# Patient Record
Sex: Female | Born: 1945 | Race: White | Hispanic: No | Marital: Married | Smoking: Never smoker
Health system: Southern US, Community
[De-identification: ages and names within clinical notes are randomized; demographics above are authoritative.]

## PROBLEM LIST (undated history)

## (undated) DIAGNOSIS — F319 Bipolar disorder, unspecified: Secondary | ICD-10-CM

## (undated) HISTORY — PX: CATARACT EXTRACTION: SUR2

## (undated) HISTORY — DX: Bipolar disorder, unspecified: F31.9

---

## 2012-02-08 HISTORY — PX: FACELIFT, LOWER 2/3: SHX1567

## 2015-02-18 DIAGNOSIS — F3181 Bipolar II disorder: Secondary | ICD-10-CM | POA: Diagnosis not present

## 2015-02-20 DIAGNOSIS — H9313 Tinnitus, bilateral: Secondary | ICD-10-CM | POA: Diagnosis not present

## 2015-02-20 DIAGNOSIS — H903 Sensorineural hearing loss, bilateral: Secondary | ICD-10-CM | POA: Diagnosis not present

## 2015-02-27 DIAGNOSIS — F3181 Bipolar II disorder: Secondary | ICD-10-CM | POA: Diagnosis not present

## 2015-03-04 DIAGNOSIS — F3181 Bipolar II disorder: Secondary | ICD-10-CM | POA: Diagnosis not present

## 2015-03-16 DIAGNOSIS — Z1231 Encounter for screening mammogram for malignant neoplasm of breast: Secondary | ICD-10-CM | POA: Diagnosis not present

## 2015-03-18 DIAGNOSIS — F3181 Bipolar II disorder: Secondary | ICD-10-CM | POA: Diagnosis not present

## 2015-03-25 DIAGNOSIS — M25561 Pain in right knee: Secondary | ICD-10-CM | POA: Diagnosis not present

## 2015-03-25 DIAGNOSIS — G8929 Other chronic pain: Secondary | ICD-10-CM | POA: Diagnosis not present

## 2015-04-01 DIAGNOSIS — F3181 Bipolar II disorder: Secondary | ICD-10-CM | POA: Diagnosis not present

## 2015-04-03 DIAGNOSIS — F3181 Bipolar II disorder: Secondary | ICD-10-CM | POA: Diagnosis not present

## 2015-04-09 DIAGNOSIS — L820 Inflamed seborrheic keratosis: Secondary | ICD-10-CM | POA: Diagnosis not present

## 2015-04-09 DIAGNOSIS — Z08 Encounter for follow-up examination after completed treatment for malignant neoplasm: Secondary | ICD-10-CM | POA: Diagnosis not present

## 2015-04-09 DIAGNOSIS — Z85828 Personal history of other malignant neoplasm of skin: Secondary | ICD-10-CM | POA: Diagnosis not present

## 2015-04-09 DIAGNOSIS — D2261 Melanocytic nevi of right upper limb, including shoulder: Secondary | ICD-10-CM | POA: Diagnosis not present

## 2015-04-09 DIAGNOSIS — Z789 Other specified health status: Secondary | ICD-10-CM | POA: Diagnosis not present

## 2015-04-09 DIAGNOSIS — L538 Other specified erythematous conditions: Secondary | ICD-10-CM | POA: Diagnosis not present

## 2015-04-09 DIAGNOSIS — L608 Other nail disorders: Secondary | ICD-10-CM | POA: Diagnosis not present

## 2015-04-09 DIAGNOSIS — D225 Melanocytic nevi of trunk: Secondary | ICD-10-CM | POA: Diagnosis not present

## 2015-04-09 DIAGNOSIS — Z7189 Other specified counseling: Secondary | ICD-10-CM | POA: Diagnosis not present

## 2015-04-09 DIAGNOSIS — D2262 Melanocytic nevi of left upper limb, including shoulder: Secondary | ICD-10-CM | POA: Diagnosis not present

## 2015-04-15 DIAGNOSIS — F3181 Bipolar II disorder: Secondary | ICD-10-CM | POA: Diagnosis not present

## 2015-05-01 DIAGNOSIS — N6489 Other specified disorders of breast: Secondary | ICD-10-CM | POA: Diagnosis not present

## 2015-05-01 DIAGNOSIS — R928 Other abnormal and inconclusive findings on diagnostic imaging of breast: Secondary | ICD-10-CM | POA: Diagnosis not present

## 2015-05-18 DIAGNOSIS — H18413 Arcus senilis, bilateral: Secondary | ICD-10-CM | POA: Diagnosis not present

## 2015-05-18 DIAGNOSIS — M1711 Unilateral primary osteoarthritis, right knee: Secondary | ICD-10-CM | POA: Diagnosis not present

## 2015-05-18 DIAGNOSIS — M25361 Other instability, right knee: Secondary | ICD-10-CM | POA: Diagnosis not present

## 2015-05-18 DIAGNOSIS — M25561 Pain in right knee: Secondary | ICD-10-CM | POA: Diagnosis not present

## 2015-05-18 DIAGNOSIS — S8001XA Contusion of right knee, initial encounter: Secondary | ICD-10-CM | POA: Diagnosis not present

## 2015-05-18 DIAGNOSIS — H25813 Combined forms of age-related cataract, bilateral: Secondary | ICD-10-CM | POA: Diagnosis not present

## 2015-06-09 DIAGNOSIS — E784 Other hyperlipidemia: Secondary | ICD-10-CM | POA: Diagnosis not present

## 2015-06-22 DIAGNOSIS — H6123 Impacted cerumen, bilateral: Secondary | ICD-10-CM | POA: Diagnosis not present

## 2015-07-02 DIAGNOSIS — F3181 Bipolar II disorder: Secondary | ICD-10-CM | POA: Diagnosis not present

## 2015-08-17 DIAGNOSIS — M1711 Unilateral primary osteoarthritis, right knee: Secondary | ICD-10-CM | POA: Diagnosis not present

## 2015-08-17 DIAGNOSIS — M7631 Iliotibial band syndrome, right leg: Secondary | ICD-10-CM | POA: Diagnosis not present

## 2015-08-17 DIAGNOSIS — M25561 Pain in right knee: Secondary | ICD-10-CM | POA: Diagnosis not present

## 2015-08-17 DIAGNOSIS — R262 Difficulty in walking, not elsewhere classified: Secondary | ICD-10-CM | POA: Diagnosis not present

## 2015-08-19 DIAGNOSIS — R262 Difficulty in walking, not elsewhere classified: Secondary | ICD-10-CM | POA: Diagnosis not present

## 2015-08-19 DIAGNOSIS — M7631 Iliotibial band syndrome, right leg: Secondary | ICD-10-CM | POA: Diagnosis not present

## 2015-08-19 DIAGNOSIS — M25561 Pain in right knee: Secondary | ICD-10-CM | POA: Diagnosis not present

## 2015-08-20 DIAGNOSIS — J029 Acute pharyngitis, unspecified: Secondary | ICD-10-CM | POA: Diagnosis not present

## 2015-08-24 DIAGNOSIS — M7631 Iliotibial band syndrome, right leg: Secondary | ICD-10-CM | POA: Diagnosis not present

## 2015-08-24 DIAGNOSIS — M25561 Pain in right knee: Secondary | ICD-10-CM | POA: Diagnosis not present

## 2015-08-24 DIAGNOSIS — R262 Difficulty in walking, not elsewhere classified: Secondary | ICD-10-CM | POA: Diagnosis not present

## 2015-08-28 DIAGNOSIS — M7631 Iliotibial band syndrome, right leg: Secondary | ICD-10-CM | POA: Diagnosis not present

## 2015-08-28 DIAGNOSIS — R262 Difficulty in walking, not elsewhere classified: Secondary | ICD-10-CM | POA: Diagnosis not present

## 2015-08-28 DIAGNOSIS — M25561 Pain in right knee: Secondary | ICD-10-CM | POA: Diagnosis not present

## 2015-08-31 DIAGNOSIS — M25561 Pain in right knee: Secondary | ICD-10-CM | POA: Diagnosis not present

## 2015-08-31 DIAGNOSIS — M7631 Iliotibial band syndrome, right leg: Secondary | ICD-10-CM | POA: Diagnosis not present

## 2015-08-31 DIAGNOSIS — R262 Difficulty in walking, not elsewhere classified: Secondary | ICD-10-CM | POA: Diagnosis not present

## 2015-09-02 DIAGNOSIS — M7631 Iliotibial band syndrome, right leg: Secondary | ICD-10-CM | POA: Diagnosis not present

## 2015-09-02 DIAGNOSIS — R262 Difficulty in walking, not elsewhere classified: Secondary | ICD-10-CM | POA: Diagnosis not present

## 2015-09-02 DIAGNOSIS — M25561 Pain in right knee: Secondary | ICD-10-CM | POA: Diagnosis not present

## 2015-09-10 DIAGNOSIS — M25561 Pain in right knee: Secondary | ICD-10-CM | POA: Diagnosis not present

## 2015-09-10 DIAGNOSIS — M7631 Iliotibial band syndrome, right leg: Secondary | ICD-10-CM | POA: Diagnosis not present

## 2015-09-10 DIAGNOSIS — R262 Difficulty in walking, not elsewhere classified: Secondary | ICD-10-CM | POA: Diagnosis not present

## 2015-09-14 DIAGNOSIS — M7631 Iliotibial band syndrome, right leg: Secondary | ICD-10-CM | POA: Diagnosis not present

## 2015-09-14 DIAGNOSIS — R262 Difficulty in walking, not elsewhere classified: Secondary | ICD-10-CM | POA: Diagnosis not present

## 2015-09-14 DIAGNOSIS — M25561 Pain in right knee: Secondary | ICD-10-CM | POA: Diagnosis not present

## 2015-09-17 DIAGNOSIS — M7631 Iliotibial band syndrome, right leg: Secondary | ICD-10-CM | POA: Diagnosis not present

## 2015-09-17 DIAGNOSIS — R262 Difficulty in walking, not elsewhere classified: Secondary | ICD-10-CM | POA: Diagnosis not present

## 2015-09-17 DIAGNOSIS — M25561 Pain in right knee: Secondary | ICD-10-CM | POA: Diagnosis not present

## 2015-09-23 DIAGNOSIS — D2361 Other benign neoplasm of skin of right upper limb, including shoulder: Secondary | ICD-10-CM | POA: Diagnosis not present

## 2015-10-19 DIAGNOSIS — M2241 Chondromalacia patellae, right knee: Secondary | ICD-10-CM | POA: Diagnosis not present

## 2015-10-19 DIAGNOSIS — M7631 Iliotibial band syndrome, right leg: Secondary | ICD-10-CM | POA: Diagnosis not present

## 2015-10-19 DIAGNOSIS — M1711 Unilateral primary osteoarthritis, right knee: Secondary | ICD-10-CM | POA: Diagnosis not present

## 2015-10-20 DIAGNOSIS — Z23 Encounter for immunization: Secondary | ICD-10-CM | POA: Diagnosis not present

## 2015-11-09 DIAGNOSIS — M1711 Unilateral primary osteoarthritis, right knee: Secondary | ICD-10-CM | POA: Diagnosis not present

## 2015-11-09 DIAGNOSIS — M7631 Iliotibial band syndrome, right leg: Secondary | ICD-10-CM | POA: Diagnosis not present

## 2015-12-01 DIAGNOSIS — M84469A Pathological fracture, unspecified tibia and fibula, initial encounter for fracture: Secondary | ICD-10-CM | POA: Diagnosis not present

## 2015-12-01 DIAGNOSIS — M1711 Unilateral primary osteoarthritis, right knee: Secondary | ICD-10-CM | POA: Diagnosis not present

## 2015-12-29 DIAGNOSIS — M1711 Unilateral primary osteoarthritis, right knee: Secondary | ICD-10-CM | POA: Diagnosis not present

## 2015-12-29 DIAGNOSIS — M84469D Pathological fracture, unspecified tibia and fibula, subsequent encounter for fracture with routine healing: Secondary | ICD-10-CM | POA: Diagnosis not present

## 2016-01-04 DIAGNOSIS — R5383 Other fatigue: Secondary | ICD-10-CM | POA: Diagnosis not present

## 2016-01-04 DIAGNOSIS — F3181 Bipolar II disorder: Secondary | ICD-10-CM | POA: Diagnosis not present

## 2016-01-04 DIAGNOSIS — E7800 Pure hypercholesterolemia, unspecified: Secondary | ICD-10-CM | POA: Diagnosis not present

## 2016-01-04 DIAGNOSIS — G251 Drug-induced tremor: Secondary | ICD-10-CM | POA: Diagnosis not present

## 2016-01-04 DIAGNOSIS — Z79899 Other long term (current) drug therapy: Secondary | ICD-10-CM | POA: Diagnosis not present

## 2016-03-24 DIAGNOSIS — H938X3 Other specified disorders of ear, bilateral: Secondary | ICD-10-CM | POA: Diagnosis not present

## 2016-03-25 DIAGNOSIS — Z111 Encounter for screening for respiratory tuberculosis: Secondary | ICD-10-CM | POA: Diagnosis not present

## 2016-04-08 DIAGNOSIS — Z1231 Encounter for screening mammogram for malignant neoplasm of breast: Secondary | ICD-10-CM | POA: Diagnosis not present

## 2016-05-18 DIAGNOSIS — M25861 Other specified joint disorders, right knee: Secondary | ICD-10-CM | POA: Diagnosis not present

## 2016-05-18 DIAGNOSIS — R2689 Other abnormalities of gait and mobility: Secondary | ICD-10-CM | POA: Diagnosis not present

## 2016-05-18 DIAGNOSIS — R278 Other lack of coordination: Secondary | ICD-10-CM | POA: Diagnosis not present

## 2016-05-24 DIAGNOSIS — L814 Other melanin hyperpigmentation: Secondary | ICD-10-CM | POA: Diagnosis not present

## 2016-05-24 DIAGNOSIS — D1801 Hemangioma of skin and subcutaneous tissue: Secondary | ICD-10-CM | POA: Diagnosis not present

## 2016-05-24 DIAGNOSIS — L821 Other seborrheic keratosis: Secondary | ICD-10-CM | POA: Diagnosis not present

## 2016-05-25 DIAGNOSIS — R278 Other lack of coordination: Secondary | ICD-10-CM | POA: Diagnosis not present

## 2016-05-25 DIAGNOSIS — R2689 Other abnormalities of gait and mobility: Secondary | ICD-10-CM | POA: Diagnosis not present

## 2016-05-25 DIAGNOSIS — M25861 Other specified joint disorders, right knee: Secondary | ICD-10-CM | POA: Diagnosis not present

## 2016-06-02 DIAGNOSIS — M1731 Unilateral post-traumatic osteoarthritis, right knee: Secondary | ICD-10-CM | POA: Diagnosis not present

## 2016-06-02 DIAGNOSIS — F319 Bipolar disorder, unspecified: Secondary | ICD-10-CM | POA: Diagnosis not present

## 2016-06-02 DIAGNOSIS — M25861 Other specified joint disorders, right knee: Secondary | ICD-10-CM | POA: Diagnosis not present

## 2016-06-02 DIAGNOSIS — R278 Other lack of coordination: Secondary | ICD-10-CM | POA: Diagnosis not present

## 2016-06-02 DIAGNOSIS — R2689 Other abnormalities of gait and mobility: Secondary | ICD-10-CM | POA: Diagnosis not present

## 2016-06-02 DIAGNOSIS — G250 Essential tremor: Secondary | ICD-10-CM | POA: Diagnosis not present

## 2016-06-02 DIAGNOSIS — L989 Disorder of the skin and subcutaneous tissue, unspecified: Secondary | ICD-10-CM | POA: Diagnosis not present

## 2016-06-08 DIAGNOSIS — M1711 Unilateral primary osteoarthritis, right knee: Secondary | ICD-10-CM | POA: Diagnosis not present

## 2016-06-13 DIAGNOSIS — R278 Other lack of coordination: Secondary | ICD-10-CM | POA: Diagnosis not present

## 2016-06-13 DIAGNOSIS — M25861 Other specified joint disorders, right knee: Secondary | ICD-10-CM | POA: Diagnosis not present

## 2016-06-13 DIAGNOSIS — R2689 Other abnormalities of gait and mobility: Secondary | ICD-10-CM | POA: Diagnosis not present

## 2016-06-16 DIAGNOSIS — R2689 Other abnormalities of gait and mobility: Secondary | ICD-10-CM | POA: Diagnosis not present

## 2016-06-16 DIAGNOSIS — M25861 Other specified joint disorders, right knee: Secondary | ICD-10-CM | POA: Diagnosis not present

## 2016-06-16 DIAGNOSIS — R278 Other lack of coordination: Secondary | ICD-10-CM | POA: Diagnosis not present

## 2016-06-18 DIAGNOSIS — M1711 Unilateral primary osteoarthritis, right knee: Secondary | ICD-10-CM | POA: Diagnosis not present

## 2016-06-21 DIAGNOSIS — Z79899 Other long term (current) drug therapy: Secondary | ICD-10-CM | POA: Diagnosis not present

## 2016-06-21 DIAGNOSIS — Z0289 Encounter for other administrative examinations: Secondary | ICD-10-CM | POA: Diagnosis not present

## 2016-06-21 DIAGNOSIS — F3131 Bipolar disorder, current episode depressed, mild: Secondary | ICD-10-CM | POA: Diagnosis not present

## 2016-06-24 DIAGNOSIS — M1711 Unilateral primary osteoarthritis, right knee: Secondary | ICD-10-CM | POA: Diagnosis not present

## 2016-06-28 DIAGNOSIS — F3131 Bipolar disorder, current episode depressed, mild: Secondary | ICD-10-CM | POA: Diagnosis not present

## 2016-07-02 ENCOUNTER — Ambulatory Visit (INDEPENDENT_AMBULATORY_CARE_PROVIDER_SITE_OTHER): Payer: Medicare Other | Admitting: Physician Assistant

## 2016-07-02 ENCOUNTER — Encounter: Payer: Self-pay | Admitting: Physician Assistant

## 2016-07-02 VITALS — BP 90/58 | HR 84 | Temp 98.3°F | Resp 16 | Ht 64.0 in | Wt 131.4 lb

## 2016-07-02 DIAGNOSIS — R591 Generalized enlarged lymph nodes: Secondary | ICD-10-CM

## 2016-07-02 DIAGNOSIS — J029 Acute pharyngitis, unspecified: Secondary | ICD-10-CM | POA: Diagnosis not present

## 2016-07-02 LAB — POCT RAPID STREP A (OFFICE): RAPID STREP A SCREEN: NEGATIVE

## 2016-07-02 MED ORDER — AMOXICILLIN 875 MG PO TABS: 875.0000 mg | ORAL_TABLET | Freq: Two times a day (BID) | ORAL | 0 refills | Status: DC

## 2016-07-02 NOTE — Progress Notes (Signed)
07/03/2016 9:30 AM   DOB: 01-08-1946 / MRN: [id number]  SUBJECTIVE:  Sandra Cox is a 71 y.o. female presenting for sore throat, cough, nasal congestion that has persisted for about 5 days now.  She associates laryngitis and tactile fever. Has taken decongestants and ASA and these are helping her somewhat. She is getting worse or better. She has a long history of low blood pressure.    Current Outpatient Prescriptions:  .  diphenoxylate-atropine (LOMOTIL) 2.5-0.025 MG tablet, Take 1 tablet by mouth 4 (four) times daily as needed for diarrhea or loose stools., Disp: , Rfl:  .  lithium carbonate 150 MG capsule, Take 150 mg by mouth 3 (three) times daily with meals., Disp: , Rfl:  .  lurasidone (LATUDA) 20 MG TABS tablet, Take 20 mg by mouth daily., Disp: , Rfl:  .  Pitavastatin Calcium (LIVALO) 2 MG TABS, Take 1 tablet by mouth., Disp: , Rfl:  .  venlafaxine XR (EFFEXOR XR) 37.5 MG 24 hr capsule, Take 37.5 mg by mouth daily with breakfast., Disp: , Rfl:  .  amoxicillin (AMOXIL) 875 MG tablet, Take 1 tablet (875 mg total) by mouth 2 (two) times daily., Disp: 20 tablet, Rfl: 0    There is no immunization history on file for this patient.   She has no allergies on file.   She  has a past medical history of Bipolar disorder ([place]).    She  reports that she has never smoked. She has never used smokeless tobacco. She reports that she does not drink alcohol or use drugs. She  reports that she does not currently engage in sexual activity. The patient  has a past surgical history that includes Facelift, lower 2/3 (2014).  Her Family history is unknown by patient.  Review of Systems  Respiratory: Negative for cough.   Cardiovascular: Negative for chest pain and leg swelling.  Musculoskeletal: Negative for falls.  Neurological: Negative for dizziness and headaches.    The problem list and medications were reviewed and updated by myself where necessary and exist elsewhere in the encounter.    OBJECTIVE:  BP (!) 90/58 (BP Location: Right Arm, Patient Position: Sitting, Cuff Size: Normal)   Pulse 84   Temp 98.3 F (36.8 C) (Oral)   Resp 16   Ht 5\' 4"  (1.626 m)   Wt 131 lb 6 oz (59.6 kg)   SpO2 98%   BMI 22.55 kg/m   BP Readings from Last 3 Encounters:  07/02/16 (!) 90/58     Physical Exam  Constitutional: She is active.  Non-toxic appearance.  HENT:  Right Ear: Hearing, tympanic membrane, external ear and ear canal normal.  Left Ear: Hearing, tympanic membrane, external ear and ear canal normal.  Nose: Nose normal. Right sinus exhibits no maxillary sinus tenderness and no frontal sinus tenderness. Left sinus exhibits no maxillary sinus tenderness and no frontal sinus tenderness.  Mouth/Throat: Uvula is midline and mucous membranes are normal. Mucous membranes are not dry. Posterior oropharyngeal erythema present. No oropharyngeal exudate, posterior oropharyngeal edema or tonsillar abscesses.  Cardiovascular: Normal rate, regular rhythm, S1 normal, S2 normal, normal heart sounds and intact distal pulses.  Exam reveals no gallop, no friction rub and no decreased pulses.   No murmur heard. Pulmonary/Chest: Effort normal. No stridor. No tachypnea. No respiratory distress. She has no wheezes. She has no rales.  Abdominal: She exhibits no distension.  Musculoskeletal: She exhibits no edema.  Lymphadenopathy:       Head (right side): No submandibular and  no tonsillar adenopathy present.       Head (left side): No submandibular and no tonsillar adenopathy present.    She has no cervical adenopathy.  Neurological: She is alert.  Skin: Skin is warm and dry. She is not diaphoretic. No pallor.    Results for orders placed or performed in visit on 07/02/16 (from the past 72 hour(s))  POCT rapid strep A     Status: None   Collection Time: 07/02/16  4:18 PM  Result Value Ref Range   Rapid Strep A Screen Negative Negative    No results found.  ASSESSMENT AND PLAN:  Sandra Cox  was seen today for sore throat and cough.  Diagnoses and all orders for this visit:  Sore throat: She hs cough, however the sore throat is somewhat prolonged for a viral uri and she has left sided tonsillar lymphadenopathy along with subjective fever.  She preferes to treat and await culture results regardless of rapid results.  Will treat and call if culture is negative.  -     amoxicillin (AMOXIL) 875 MG tablet; Take 1 tablet (875 mg total) by mouth 2 (two) times daily. -     POCT rapid strep A -     Culture, Group A Strep  Lymphadenopathy: See problem one.     The patient is advised to call or return to clinic if she does not see an improvement in symptoms, or to seek the care of the closest emergency department if she worsens with the above plan.   Philis Fendt, MHS, PA-C [facility] 07/03/2016 9:30 AM

## 2016-07-02 NOTE — Patient Instructions (Addendum)
Continue home treatments. Start amoxicillin.  I will contact you if the strep culture is negative, otherwise continue the abx.     IF you received an x-ray today, you will receive an invoice from [facility]. Please contact [facility] at [phone] with questions or concerns regarding your invoice.   IF you received labwork today, you will receive an invoice from [place]. Please contact [facility] at [phone] with questions or concerns regarding your invoice.   Our billing staff will not be able to assist you with questions regarding bills from these companies.  You will be contacted with the lab results as soon as they are available. The fastest way to get your results is to activate your My Chart account. Instructions are located on the last page of this paperwork. If you have not heard from [place] regarding the results in 2 weeks, please contact this office.

## 2016-07-05 LAB — CULTURE, GROUP A STREP: STREP A CULTURE: NEGATIVE

## 2016-07-15 DIAGNOSIS — Z79899 Other long term (current) drug therapy: Secondary | ICD-10-CM | POA: Diagnosis not present

## 2016-07-27 DIAGNOSIS — F3131 Bipolar disorder, current episode depressed, mild: Secondary | ICD-10-CM | POA: Diagnosis not present

## 2016-08-19 DIAGNOSIS — M1711 Unilateral primary osteoarthritis, right knee: Secondary | ICD-10-CM | POA: Diagnosis not present

## 2016-08-25 DIAGNOSIS — N183 Chronic kidney disease, stage 3 (moderate): Secondary | ICD-10-CM | POA: Diagnosis not present

## 2016-08-25 DIAGNOSIS — F319 Bipolar disorder, unspecified: Secondary | ICD-10-CM | POA: Diagnosis not present

## 2016-09-12 DIAGNOSIS — F3131 Bipolar disorder, current episode depressed, mild: Secondary | ICD-10-CM | POA: Diagnosis not present

## 2016-09-30 DIAGNOSIS — M1711 Unilateral primary osteoarthritis, right knee: Secondary | ICD-10-CM | POA: Diagnosis not present

## 2016-10-06 DIAGNOSIS — M1711 Unilateral primary osteoarthritis, right knee: Secondary | ICD-10-CM | POA: Diagnosis not present

## 2016-10-11 DIAGNOSIS — R197 Diarrhea, unspecified: Secondary | ICD-10-CM | POA: Diagnosis not present

## 2016-10-13 DIAGNOSIS — M1711 Unilateral primary osteoarthritis, right knee: Secondary | ICD-10-CM | POA: Diagnosis not present

## 2016-10-25 DIAGNOSIS — F3176 Bipolar disorder, in full remission, most recent episode depressed: Secondary | ICD-10-CM | POA: Diagnosis not present

## 2016-11-04 DIAGNOSIS — L400 Psoriasis vulgaris: Secondary | ICD-10-CM | POA: Diagnosis not present

## 2016-11-04 DIAGNOSIS — L570 Actinic keratosis: Secondary | ICD-10-CM | POA: Diagnosis not present

## 2016-11-04 DIAGNOSIS — D1801 Hemangioma of skin and subcutaneous tissue: Secondary | ICD-10-CM | POA: Diagnosis not present

## 2016-11-04 DIAGNOSIS — L814 Other melanin hyperpigmentation: Secondary | ICD-10-CM | POA: Diagnosis not present

## 2016-11-04 DIAGNOSIS — Z85828 Personal history of other malignant neoplasm of skin: Secondary | ICD-10-CM | POA: Diagnosis not present

## 2016-11-04 DIAGNOSIS — L821 Other seborrheic keratosis: Secondary | ICD-10-CM | POA: Diagnosis not present

## 2016-11-04 DIAGNOSIS — D225 Melanocytic nevi of trunk: Secondary | ICD-10-CM | POA: Diagnosis not present

## 2016-11-24 DIAGNOSIS — F314 Bipolar disorder, current episode depressed, severe, without psychotic features: Secondary | ICD-10-CM | POA: Diagnosis not present

## 2016-11-25 DIAGNOSIS — M1711 Unilateral primary osteoarthritis, right knee: Secondary | ICD-10-CM | POA: Diagnosis not present

## 2016-12-07 DIAGNOSIS — F3131 Bipolar disorder, current episode depressed, mild: Secondary | ICD-10-CM | POA: Diagnosis not present

## 2016-12-08 DIAGNOSIS — Z0000 Encounter for general adult medical examination without abnormal findings: Secondary | ICD-10-CM | POA: Diagnosis not present

## 2016-12-08 DIAGNOSIS — Z23 Encounter for immunization: Secondary | ICD-10-CM | POA: Diagnosis not present

## 2016-12-08 DIAGNOSIS — Z1389 Encounter for screening for other disorder: Secondary | ICD-10-CM | POA: Diagnosis not present

## 2016-12-08 DIAGNOSIS — Z79899 Other long term (current) drug therapy: Secondary | ICD-10-CM | POA: Diagnosis not present

## 2016-12-08 DIAGNOSIS — M810 Age-related osteoporosis without current pathological fracture: Secondary | ICD-10-CM | POA: Diagnosis not present

## 2016-12-08 DIAGNOSIS — F319 Bipolar disorder, unspecified: Secondary | ICD-10-CM | POA: Diagnosis not present

## 2016-12-08 DIAGNOSIS — E7800 Pure hypercholesterolemia, unspecified: Secondary | ICD-10-CM | POA: Diagnosis not present

## 2016-12-08 DIAGNOSIS — N183 Chronic kidney disease, stage 3 (moderate): Secondary | ICD-10-CM | POA: Diagnosis not present

## 2016-12-08 DIAGNOSIS — H903 Sensorineural hearing loss, bilateral: Secondary | ICD-10-CM | POA: Diagnosis not present

## 2016-12-13 DIAGNOSIS — F3131 Bipolar disorder, current episode depressed, mild: Secondary | ICD-10-CM | POA: Diagnosis not present

## 2016-12-20 DIAGNOSIS — F3132 Bipolar disorder, current episode depressed, moderate: Secondary | ICD-10-CM | POA: Diagnosis not present

## 2017-01-09 DIAGNOSIS — F3176 Bipolar disorder, in full remission, most recent episode depressed: Secondary | ICD-10-CM | POA: Diagnosis not present

## 2017-03-02 DIAGNOSIS — M25531 Pain in right wrist: Secondary | ICD-10-CM | POA: Diagnosis not present

## 2017-03-21 DIAGNOSIS — F3131 Bipolar disorder, current episode depressed, mild: Secondary | ICD-10-CM | POA: Diagnosis not present

## 2017-03-30 DIAGNOSIS — Z79899 Other long term (current) drug therapy: Secondary | ICD-10-CM | POA: Diagnosis not present

## 2017-05-10 DIAGNOSIS — H2513 Age-related nuclear cataract, bilateral: Secondary | ICD-10-CM | POA: Diagnosis not present

## 2017-06-09 DIAGNOSIS — Z79899 Other long term (current) drug therapy: Secondary | ICD-10-CM | POA: Diagnosis not present

## 2017-06-15 ENCOUNTER — Encounter (HOSPITAL_COMMUNITY): Payer: Self-pay | Admitting: Psychiatry

## 2017-06-15 ENCOUNTER — Ambulatory Visit (INDEPENDENT_AMBULATORY_CARE_PROVIDER_SITE_OTHER): Payer: Medicare Other | Admitting: Psychiatry

## 2017-06-15 VITALS — BP 122/74 | HR 95 | Ht 65.5 in | Wt 128.0 lb

## 2017-06-15 DIAGNOSIS — F4010 Social phobia, unspecified: Secondary | ICD-10-CM | POA: Diagnosis not present

## 2017-06-15 DIAGNOSIS — F319 Bipolar disorder, unspecified: Secondary | ICD-10-CM | POA: Diagnosis not present

## 2017-06-15 DIAGNOSIS — F411 Generalized anxiety disorder: Secondary | ICD-10-CM | POA: Diagnosis not present

## 2017-06-15 DIAGNOSIS — Z634 Disappearance and death of family member: Secondary | ICD-10-CM | POA: Diagnosis not present

## 2017-06-15 DIAGNOSIS — F331 Major depressive disorder, recurrent, moderate: Secondary | ICD-10-CM | POA: Diagnosis not present

## 2017-06-15 DIAGNOSIS — F909 Attention-deficit hyperactivity disorder, unspecified type: Secondary | ICD-10-CM | POA: Diagnosis not present

## 2017-06-15 DIAGNOSIS — R450 Nervousness: Secondary | ICD-10-CM

## 2017-06-15 MED ORDER — VENLAFAXINE HCL ER 150 MG PO CP24: 150.0000 mg | ORAL_CAPSULE | Freq: Every day | ORAL | 0 refills | Status: DC

## 2017-06-15 MED ORDER — LAMOTRIGINE 25 MG PO TABS: ORAL_TABLET | ORAL | 0 refills | Status: DC

## 2017-06-15 MED ORDER — QUETIAPINE FUMARATE 100 MG PO TABS: ORAL_TABLET | ORAL | 0 refills | Status: DC

## 2017-06-15 NOTE — Addendum Note (Signed)
Addended by: Berniece Andreas T on: 06/15/2017 03:03 PM   Modules accepted: Orders

## 2017-06-15 NOTE — Progress Notes (Signed)
Psychiatric Initial Adult Assessment   Patient Identification: Sandra Cox MRN:  [id number] Date of Evaluation:  06/15/2017 Referral Source: Self-referred. Chief Complaint:  I have anxiety and depression. Visit Diagnosis:    ICD-10-CM   1. Bipolar I disorder (HCC) F31.9 QUEtiapine (SEROQUEL) 100 MG tablet  2. GAD (generalized anxiety disorder) F41.1 venlafaxine XR (EFFEXOR-XR) 150 MG 24 hr capsule    lamoTRIgine (LAMICTAL) 25 MG tablet    History of Present Illness:   Patient is 72 year old Caucasian, separated female who is self-referred for the management of her mental illness.  Patient has bipolar disorder since 70s.  She has been seeing psychiatrist since then.  Patient moved from [place] year ago to live in a retirement community.  She saw [name] at [facility] but did not like the care and like to establish the care at our facility.  Patient told she went into severe depression 2-1/2 months ago and her nurse practitioner [name] told to increase Effexor and Latuda but she was not comfortable.  Patient told that she was able to come out of the depression and she is doing better.  However she still feels anxious and nervous.  Patient told 2 weeks ago her dog died and she was very depressed.  Patient has been separated from her husband after 53 years of marriage.  Patient told her husband is very controlling and demanding but since she is separated they are good friends.  Patient told he is still comes to visit her.  Patient did not like [place] where she stayed one year.  She is happy at her current retirement home.  Patient father-in-law who is 70 year old also live in the same retirement home.  Patient is not sure what trigger depression 2-1/2 months ago.  She was feeling depressed, crying spells, isolated, withdrawn and hopeless.  Patient has 2 daughter who lives out of town.  Patient has no other support system.  She is trying to make new friends but most of them are very  old and she has seen a lot of people deceased in the last year.  Patient appears very nervous and anxious.  She also have difficulty remembering things.  She has tremors which she described for past few years and she takes nadolol 20 mg twice a day.  She is taking multiple psychiatric medication.  She is taking lithium 150 mg daily, Latuda 40 mg daily, Effexor 150 mg daily, Celexa 10 mg daily, Seroquel 100 mg at bedtime and Ativan 0.5 mg half to 1 tablet at bedtime.  Patient told she has been taking most of these medication for many years however last year in [place] and Seroquel was added by a psychiatrist.  It is unclear why she is taking 2 antipsychotic, 2 antidepressant, one mood stabilizer and one antianxiety medication.  Patient reported since she came out from the depression she is feeling better.  Her sleep is improved.  She has more energy.  She denies any paranoia, suicidal thoughts or homicidal thought.  Patient has established a new primary care physician.  Recently she had blood work.  Her lithium level is 0.3, her TSH is normal.  Recently she had other labs done but we do not have results available.  Patient told she has borderline kidney function but she do not remember the numbers.  Patient denies any paranoia, hallucination, anger, mania and psychosis.  She is not seeing any therapist.  She denies drinking or using any illegal substances.     Associated Signs/Symptoms:  Depression Symptoms:  depressed mood, difficulty concentrating, anxiety, (Hypo) Manic Symptoms:  Irritable Mood, Anxiety Symptoms:  Excessive Worry, Social Anxiety, Psychotic Symptoms:  no psychotic symtoms PTSD Symptoms: Negative  Past Psychiatric History:  Patient had history of depression since 65s.  She has seen psychiatrist since then.  She believe her mental illness got worse in her marriage.  Her husband is from [place] and she had lived many places.  She saw psychiatrist in [place] for 15 years and  then moved to [place] where she continued the care with a psychiatrist for 1 year and then last year moved to [place] in a retirement home.  She was seeing [facility].  In the past she had tried Zoloft but that did not work.  In [place] and [place] was added.  Patient denies any history of psychiatric inpatient treatment or any suicidal attempt.  She endorsed history of mania and depression.  She had manic cycles 3-4 times a year.  Previous Psychotropic Medications: Yes   Substance Abuse History in the last 12 months:  No.  Consequences of Substance Abuse: Negative  Past Medical History:  Past Medical History:  Diagnosis Date  . Bipolar disorder [facility])     Past Surgical History:  Procedure Laterality Date  . FACELIFT, LOWER 2/3  2014    Family Psychiatric History: Reviewed.  Family History:  Family History  Family history unknown: Yes    Social History:   Social History   Socioeconomic History  . Marital status: Married    Spouse name: Not on file  . Number of children: 2  . Years of education: Not on file  . Highest education level: Bachelor's degree (e.g., BA, AB, BS)  Occupational History  . Not on file  Social Needs  . Financial resource strain: Not hard at all  . Food insecurity:    Worry: Never true    Inability: Never true  . Transportation needs:    Medical: No    Non-medical: No  Tobacco Use  . Smoking status: Never Smoker  . Smokeless tobacco: Never Used  Substance and Sexual Activity  . Alcohol use: No  . Drug use: No  . Sexual activity: Not Currently  Lifestyle  . Physical activity:    Days per week: 0 days    Minutes per session: 0 min  . Stress: To some extent  Relationships  . Social connections:    Talks on phone: Not on file    Gets together: Not on file    Attends religious service: Not on file    Active member of club or organization: Not on file    Attends meetings of clubs or organizations: Not on file     Relationship status: Not on file  Other Topics Concern  . Not on file  Social History Narrative  . Not on file    Additional Social History:  Patient born in [place].  She married to her husband who was in [place] and she had moved to more than 30 places.  She has 2 daughter, one lives in [place] and other lives in [place].  Patient is separated 4 years ago but she is still have a good relationship with her husband.  She told her husband is very controlling and demanding and she could not continue living with him.  Allergies:  No Known Allergies  Metabolic Disorder Labs: No results found for: HGBA1C, MPG No results found for: PROLACTIN No results found for: CHOL, TRIG, HDL, CHOLHDL,  VLDL, LDLCALC   Current Medications: Current Outpatient Medications  Medication Sig Dispense Refill  . lithium carbonate 150 MG capsule Take 150 mg by mouth 3 (three) times daily with meals.    Marland Kitchen LORazepam (ATIVAN) 0.5 MG tablet Take 0.5 mg by mouth daily.    . nadolol (CORGARD) 20 MG tablet Take 20 mg by mouth 2 (two) times daily.    . Pitavastatin Calcium (LIVALO) 2 MG TABS Take 1 tablet by mouth.    . venlafaxine XR (EFFEXOR-XR) 150 MG 24 hr capsule Take 1 capsule (150 mg total) by mouth daily with breakfast. 30 capsule 0  . lamoTRIgine (LAMICTAL) 25 MG tablet Take one tab daily for 1 week and than 2 tab daily 60 tablet 0  . QUEtiapine (SEROQUEL) 100 MG tablet quetiapine 100 mg tablet 30 tablet 0   No current facility-administered medications for this visit.     Neurologic: Headache: No Seizure: No Paresthesias:No  Musculoskeletal: Strength & Muscle Tone: within normal limits Gait & Station: normal Patient leans: N/A  Psychiatric Specialty Exam: Review of Systems  Psychiatric/Behavioral: The patient is nervous/anxious.     Blood pressure 122/74, pulse 95, height 5' 5.5" (1.664 m), weight 128 lb (58.1 kg).Body mass index is 20.98 kg/m.  General Appearance: Well Groomed   Eye Contact:  Fair  Speech:  Slow  Volume:  Decreased  Mood:  Anxious  Affect:  Congruent  Thought Process:  Goal Directed  Orientation:  Full (Time, Place, and Person)  Thought Content:  Rumination  Suicidal Thoughts:  No  Homicidal Thoughts:  No  Memory:  Immediate;   Good Recent;   Good Remote;   Fair  Judgement:  Good  Insight:  Good  Psychomotor Activity:  Decreased  Concentration:  Concentration: Fair and Attention Span: Fair  Recall:  Good  Fund of Knowledge:Good  Language: Good  Akathisia:  No  Handed:  Right  AIMS (if indicated):  0  Assets:  Communication Skills Desire for Improvement Housing  ADL's:  Intact  Cognition: WNL  Sleep: Fair   Assessment: Bipolar disorder, depressed type.  Generalized anxiety disorder.  Plan: I review her symptoms, history, current medication.  Patient is taking multiple psychotropic medication.  It is unclear why she is taking 2 antipsychotic medication and to antidepressant.  She is willing to cut down the medication but also open to have genetic testing to see what medicine she can take.  We discussed potential side effects of 2 antipsychotic medications.  I recommended to discontinue Latuda and continue Seroquel 100 mg at bedtime.  I will also discontinue lithium since patient is taking subtherapeutic dose.  She reported her kidney function tests are borderline.  We will get blood work results from previous physician.  I will try Lamictal 25 mg daily for 1 week and then 50 mg daily.  Continue Effexor XR 150 mg daily.  Discontinue Celexa.  Continue lorazepam 0.5 mg as needed for insomnia.   Kathlee Nations, MD 5/9/201912:29 PM

## 2017-06-26 DIAGNOSIS — M810 Age-related osteoporosis without current pathological fracture: Secondary | ICD-10-CM | POA: Diagnosis not present

## 2017-06-26 DIAGNOSIS — F319 Bipolar disorder, unspecified: Secondary | ICD-10-CM | POA: Diagnosis not present

## 2017-06-26 DIAGNOSIS — G250 Essential tremor: Secondary | ICD-10-CM | POA: Diagnosis not present

## 2017-06-26 DIAGNOSIS — E7800 Pure hypercholesterolemia, unspecified: Secondary | ICD-10-CM | POA: Diagnosis not present

## 2017-06-26 DIAGNOSIS — N183 Chronic kidney disease, stage 3 (moderate): Secondary | ICD-10-CM | POA: Diagnosis not present

## 2017-06-27 ENCOUNTER — Encounter: Payer: Self-pay | Admitting: Neurology

## 2017-06-29 NOTE — Progress Notes (Signed)
Subjective:   Sandra Cox was seen in consultation in the movement disorder clinic at the request of Lavone Orn, MD.  The evaluation is for tremor.  The records that were made available to me were reviewed.  Tremor started approximately 4-5 years ago and started with the R hand with eating/writing.  It started in the L hand about a year ago.  There is no family hx of tremor.  Been on lithium since 1988.  Been on latuda for 2 months.  Been on seroquel for about a year.    Affected by caffeine:  Yes.   (1 cup coffee per day) Affected by alcohol:  Doesn't drink EtOH Affected by stress:  Yes.   Affected by fatigue:  No. Spills soup if on spoon:  No. Spills glass of liquid if full:  No. Affects ADL's (tying shoes, brushing teeth, etc):  No.  Current/Previously tried tremor medications: nadolol but didn't help tremor  Current medications that may exacerbate tremor:  was on lithium for bipolar depression but was d/c few weeks ago; was on latuda until few weeks ago; on seroquel  Reviewed psychiatry records.  The patient presented to Dr. Adele Schilder as a new patient on Jun 15, 2017.  When she came to him, she was taking lithium, 150 mg daily, Latuda, 40 mg daily, Seroquel 100 mg at bedtime, in addition to other antidepressants.  Dr. Adele Schilder noted that she was on 2 different antipsychotics and he recommended discontinuing the Latuda and continuing the Seroquel.  Lithium was also discontinued.  Outside reports reviewed: historical medical records, lab reports and referral letter/letters.  No Known Allergies  Outpatient Encounter Medications as of 07/04/2017  Medication Sig  . lamoTRIgine (LAMICTAL) 25 MG tablet Take one tab daily for 1 week and than 2 tab daily  . LORazepam (ATIVAN) 0.5 MG tablet Take 0.5 mg by mouth daily.  . nadolol (CORGARD) 20 MG tablet Take 20 mg by mouth 2 (two) times daily.  . QUEtiapine (SEROQUEL) 100 MG tablet quetiapine 100 mg tablet  . venlafaxine XR (EFFEXOR-XR) 150 MG  24 hr capsule Take 1 capsule (150 mg total) by mouth daily with breakfast.  . [DISCONTINUED] Pitavastatin Calcium (LIVALO) 2 MG TABS Take 1 tablet by mouth.   No facility-administered encounter medications on file as of 07/04/2017.     Past Medical History:  Diagnosis Date  . Bipolar disorder [facility])     Past Surgical History:  Procedure Laterality Date  . FACELIFT, LOWER 2/3  2014    Social History   Socioeconomic History  . Marital status: Married    Spouse name: Not on file  . Number of children: 2  . Years of education: Not on file  . Highest education level: Bachelor's degree (e.g., BA, AB, BS)  Occupational History  . Not on file  Social Needs  . Financial resource strain: Not hard at all  . Food insecurity:    Worry: Never true    Inability: Never true  . Transportation needs:    Medical: No    Non-medical: No  Tobacco Use  . Smoking status: Never Smoker  . Smokeless tobacco: Never Used  Substance and Sexual Activity  . Alcohol use: No  . Drug use: No  . Sexual activity: Not Currently  Lifestyle  . Physical activity:    Days per week: 0 days    Minutes per session: 0 min  . Stress: To some extent  Relationships  . Social connections:    Talks on  phone: Not on file    Gets together: Not on file    Attends religious service: Not on file    Active member of club or organization: Not on file    Attends meetings of clubs or organizations: Not on file    Relationship status: Not on file  . Intimate partner violence:    Fear of current or ex partner: Not on file    Emotionally abused: Not on file    Physically abused: Not on file    Forced sexual activity: Not on file  Other Topics Concern  . Not on file  Social History Narrative  . Not on file    Family Status  Relation Name Status  . Mother  Deceased  . Father  Deceased  . Daughter x2 Alive    Review of Systems A complete 10 system ROS was obtained and was negative apart from what is  mentioned.   Objective:   VITALS:   Vitals:   07/04/17 0917  BP: 102/60  Pulse: 88  SpO2: 97%  Weight: 128 lb (58.1 kg)  Height: 5' 5.5" (1.664 m)   Gen:  Appears stated age and in NAD. HEENT:  Normocephalic, atraumatic. The mucous membranes are moist. The superficial temporal arteries are without ropiness or tenderness. Cardiovascular: Regular rate and rhythm. Lungs: Clear to auscultation bilaterally. Neck: There are no carotid bruits noted bilaterally.  NEUROLOGICAL:  Orientation:  The patient is alert and oriented x 3.  Recent and remote memory are intact.  Attention span and concentration are normal.  Able to name objects and repeat without trouble.  Fund of knowledge is appropriate Cranial nerves: There is good facial symmetry. There is mild facial hypomimia.   Extraocular muscles are intact and visual fields are full to confrontational testing. Speech is fluent and clear. Soft palate rises symmetrically and there is no tongue deviation. Hearing is intact to conversational tone. Tone: Tone is good throughout. Sensation: Sensation is intact to light touch and pinprick throughout (facial, trunk, extremities). Vibration is intact at the bilateral big toe but it is slightly decreased. There is no extinction with double simultaneous stimulation. There is no sensory dermatomal level identified. Coordination:  The patient has no dysdiadichokinesia or dysmetria. Motor: Strength is 5/5 in the bilateral upper and lower extremities.  Shoulder shrug is equal bilaterally.  There is no pronator drift.  There are no fasciculations noted. DTR's: Deep tendon reflexes are 3-3+/4 at the bilateral biceps, triceps, brachioradialis, patella and achilles.  Plantar responses are downgoing bilaterally. Gait and Station: The patient is able to ambulate without difficulty. The patient is able to heel toe walk without any difficulty. The patient is able to ambulate in a tandem fashion. The patient is able to  stand in the Romberg position.   MOVEMENT EXAM: Tremor:    There is LLE resting tremor and rare resting tremor in the thumb.  There is mouth/chin tremor.   Archimedes spirals with mild tremor on the L only.  She has intention tremor.  When the patient is asked able to pour water from one glass to another, she doesn't spill it but tremor is noted.  No results found for: TSH  I was able to get a copy of her lab work from her primary care physician dated December 08, 2016.  White blood cells are 5.8, hemoglobin 12.4, hematocrit 37.6 and platelets 180.  Sodium was 139, potassium 4.7, chloride 107, CO2 28, BUN 23, creatinine 1.35.  Glucose was 95.  AST  16, ALT 13, alkaline phosphatase 56.   Assessment/Plan:   1.  Tremor  -etiology unknown but suspect that meds are contributing source, if not primary.  Her tremor is mixed rest and intention. When pt first presented to Dr. Adele Schilder a few weeks ago, she was on lithium and 2 different antipsychotics.  Latuda and lithium were d/c (both well known sources of tremor).  She is still on seroquel.  Discussed that if due to antipsychotics it can take up to 6 months after d/c to see if due to medication (and she is still on seroquel).  Wouldn't recommend medication at this time and she agrees.  If still with tremor in 6 months (and I suspect she may), will consider DaT scan.  She and I discussed this today.  She is mildly parkinsonian, but the antipsychotics can cause this as well.  -will do TSH  -will plan on seeing her back in 6 months, sooner should new issues arise.   CC:  Lavone Orn, MD

## 2017-07-04 ENCOUNTER — Ambulatory Visit (INDEPENDENT_AMBULATORY_CARE_PROVIDER_SITE_OTHER): Payer: Medicare Other | Admitting: Neurology

## 2017-07-04 ENCOUNTER — Encounter: Payer: Self-pay | Admitting: Neurology

## 2017-07-04 ENCOUNTER — Other Ambulatory Visit: Payer: Medicare Other

## 2017-07-04 VITALS — BP 102/60 | HR 88 | Ht 65.5 in | Wt 128.0 lb

## 2017-07-04 DIAGNOSIS — G2111 Neuroleptic induced parkinsonism: Secondary | ICD-10-CM | POA: Diagnosis not present

## 2017-07-04 DIAGNOSIS — R251 Tremor, unspecified: Secondary | ICD-10-CM | POA: Diagnosis not present

## 2017-07-04 DIAGNOSIS — F319 Bipolar disorder, unspecified: Secondary | ICD-10-CM

## 2017-07-05 LAB — TSH: TSH: 0.82 mIU/L (ref 0.40–4.50)

## 2017-07-14 ENCOUNTER — Ambulatory Visit (INDEPENDENT_AMBULATORY_CARE_PROVIDER_SITE_OTHER): Payer: Medicare Other | Admitting: Psychiatry

## 2017-07-14 ENCOUNTER — Encounter (HOSPITAL_COMMUNITY): Payer: Self-pay | Admitting: Psychiatry

## 2017-07-14 DIAGNOSIS — M1711 Unilateral primary osteoarthritis, right knee: Secondary | ICD-10-CM | POA: Diagnosis not present

## 2017-07-14 DIAGNOSIS — F411 Generalized anxiety disorder: Secondary | ICD-10-CM

## 2017-07-14 DIAGNOSIS — F319 Bipolar disorder, unspecified: Secondary | ICD-10-CM

## 2017-07-14 MED ORDER — LAMOTRIGINE 25 MG PO TABS: ORAL_TABLET | ORAL | 0 refills | Status: DC

## 2017-07-14 MED ORDER — LORAZEPAM 0.5 MG PO TABS: 0.5000 mg | ORAL_TABLET | Freq: Every day | ORAL | 1 refills | Status: DC

## 2017-07-14 MED ORDER — VENLAFAXINE HCL ER 150 MG PO CP24: 150.0000 mg | ORAL_CAPSULE | Freq: Every day | ORAL | 0 refills | Status: DC

## 2017-07-14 MED ORDER — QUETIAPINE FUMARATE 100 MG PO TABS: ORAL_TABLET | ORAL | 0 refills | Status: DC
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
## 2017-07-17 ENCOUNTER — Telehealth (HOSPITAL_COMMUNITY): Payer: Self-pay

## 2017-07-17 NOTE — Telephone Encounter (Signed)
Received a fax from the pharmacy needing clarification on Quetiapine 100mg  tabs. Directions on the prescription are missing. Please review and advise.

## 2017-07-18 ENCOUNTER — Other Ambulatory Visit (HOSPITAL_COMMUNITY): Payer: Self-pay

## 2017-07-18 DIAGNOSIS — F319 Bipolar disorder, unspecified: Secondary | ICD-10-CM

## 2017-07-18 MED ORDER — QUETIAPINE FUMARATE 100 MG PO TABS: ORAL_TABLET | ORAL | 0 refills | Status: DC

## 2017-07-19 ENCOUNTER — Other Ambulatory Visit (HOSPITAL_COMMUNITY): Payer: Self-pay | Admitting: Psychiatry

## 2017-07-19 ENCOUNTER — Ambulatory Visit (INDEPENDENT_AMBULATORY_CARE_PROVIDER_SITE_OTHER): Payer: Medicare Other | Admitting: Licensed Clinical Social Worker

## 2017-07-19 ENCOUNTER — Encounter (HOSPITAL_COMMUNITY): Payer: Self-pay | Admitting: Licensed Clinical Social Worker

## 2017-07-19 DIAGNOSIS — F319 Bipolar disorder, unspecified: Secondary | ICD-10-CM | POA: Diagnosis not present

## 2017-07-19 DIAGNOSIS — F411 Generalized anxiety disorder: Secondary | ICD-10-CM

## 2017-07-19 MED ORDER — QUETIAPINE FUMARATE 100 MG PO TABS: 100.0000 mg | ORAL_TABLET | Freq: Every day | ORAL | 0 refills | Status: DC

## 2017-07-19 NOTE — Telephone Encounter (Signed)
Information was corrected by Silvio Pate, and sent to pharmacy

## 2017-07-19 NOTE — Progress Notes (Signed)
   THERAPIST PROGRESS NOTE  Session Time: 3:30 p.m. - 4:30 p.m.  Participation Level: Active  Behavioral Response: Well GroomedAlertEuthymic  Type of Therapy: Individual Therapy  Treatment Goals addressed: Diagnosis: Bipolar I Disorder and Generalized Anxiety Disorder  Interventions: CBT and Motivational Interviewing  Summary: Sandra Cox is a 72 y.o. female who presents with Diagnosis: Bipolar I Disorder and Generalized Anxiety Disorder  Suicidal/Homicidal: Nowithout intent/plan  Therapist Response: Sandra Cox participated well in CCA.  She reports that due to medication changes, she's not currently experiencing any negative symptoms of bipolar disorder.  She did identify grief over the loss of her dog, but overall she is functioning well.  Plan: Return again in 4 weeks  Diagnosis: Axis I: Bipolar, Depressed and Generalized Anxiety Disorder      Mindi Curling, LCSW 07/19/2017
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
## 2017-07-28 DIAGNOSIS — M1711 Unilateral primary osteoarthritis, right knee: Secondary | ICD-10-CM | POA: Diagnosis not present

## 2017-08-23 ENCOUNTER — Ambulatory Visit (INDEPENDENT_AMBULATORY_CARE_PROVIDER_SITE_OTHER): Payer: Medicare Other | Admitting: Licensed Clinical Social Worker

## 2017-08-23 ENCOUNTER — Encounter (HOSPITAL_COMMUNITY): Payer: Self-pay | Admitting: Licensed Clinical Social Worker

## 2017-08-23 DIAGNOSIS — F319 Bipolar disorder, unspecified: Secondary | ICD-10-CM | POA: Diagnosis not present

## 2017-08-23 DIAGNOSIS — F411 Generalized anxiety disorder: Secondary | ICD-10-CM

## 2017-08-23 NOTE — Progress Notes (Signed)
   THERAPIST PROGRESS NOTE  Session Time: 1:30pm-2:30pm  Participation Level: Active  Behavioral Response: Well GroomedAlertAnxious and Depressed  Type of Therapy: Individual Therapy  Treatment Goals addressed: Improve Psychiatric Symptoms, elevate mood (increased self-esteem, increased self-compassion, increased interaction), improve unhelpful thought patterns, controlled behavior, moderate mood, deliberate speech and thought process(improved social functioning, healthy adjustment to living situation), Learn about diagnosis, healthy coping skills  Interventions: Motivational Interviewing, CBT, Grounding & Mindfulness Techniques, psychoeducation  Summary: Sandra Cox is a 72 y.o. female who presents with Bipolar I Disorder, depressed and Generalized Anxiety Disorder.   Suicidal/Homicidal: No - without intent/plan  Therapist Response: [name] met with clinician for an individual session.  Sandra Cox discussed her psychiatric symptoms, her current life events and her homework. Sandra Cox shared increased anxiety and depression without a specific cause. Clinician explored using CBT and noted changes since husband and his partner have gone on an [place] cruise. Clinician identified that this may be saddening her due to feeling left out or somewhat jealous about not going on trips like this when they were together. Sandra Cox provided feedback about her comfort level and relationship with husband when they were together and noted that they did not "have fun" together. Sandra Cox processed experiences on vacation with husband and also in her marriage. Sandra Cox identified that she is a better person now without him, even though they are still in daily communication and she emails with his partner.  Clinician explored increasing depression sxs and noted lack of wanting to get out and see people, not wanting to clean up after herself.   Plan: Return again in 2-3 weeks  Diagnosis:     Axis I: Bipolar I Disorder, depressed and  Generalized Anxiety Disorder.    Jobe Marker Paul, LCSW 08/23/2017

## 2017-08-24 ENCOUNTER — Ambulatory Visit (HOSPITAL_COMMUNITY): Payer: Medicare Other | Admitting: Licensed Clinical Social Worker

## 2017-08-30 ENCOUNTER — Ambulatory Visit
Admission: RE | Admit: 2017-08-30 | Discharge: 2017-08-30 | Disposition: A | Discharge status: Home / Self Care | Payer: Medicare Other | Source: Ambulatory Visit | Attending: Internal Medicine | Admitting: Internal Medicine

## 2017-08-30 ENCOUNTER — Other Ambulatory Visit: Payer: Self-pay | Admitting: Internal Medicine

## 2017-08-30 DIAGNOSIS — K5900 Constipation, unspecified: Secondary | ICD-10-CM

## 2017-09-07 DIAGNOSIS — M1711 Unilateral primary osteoarthritis, right knee: Secondary | ICD-10-CM | POA: Diagnosis not present

## 2017-09-18 ENCOUNTER — Telehealth (HOSPITAL_COMMUNITY): Payer: Self-pay | Admitting: Licensed Clinical Social Worker

## 2017-09-19 NOTE — Telephone Encounter (Signed)
Arfeen patient -   Could you please review the previous message and note from last visit and let me know what you recommend? Thank you

## 2017-09-20 NOTE — Telephone Encounter (Signed)
Is cobos available to see her?  She can see me for a visit if I have slots.  Lithium would be pretty harsh for her age and carry many risks.  There is probably safer ways we can help

## 2017-09-20 NOTE — Telephone Encounter (Signed)
Called patient and left voicemail letting her know that we can get her in to see a doctor sooner. I left my number for her to call back

## 2017-09-21 ENCOUNTER — Ambulatory Visit (INDEPENDENT_AMBULATORY_CARE_PROVIDER_SITE_OTHER): Payer: Medicare Other | Admitting: Licensed Clinical Social Worker

## 2017-09-21 ENCOUNTER — Encounter (HOSPITAL_COMMUNITY): Payer: Self-pay | Admitting: Licensed Clinical Social Worker

## 2017-09-21 DIAGNOSIS — F319 Bipolar disorder, unspecified: Secondary | ICD-10-CM

## 2017-09-21 DIAGNOSIS — F411 Generalized anxiety disorder: Secondary | ICD-10-CM

## 2017-09-21 NOTE — Progress Notes (Signed)
   THERAPIST PROGRESS NOTE  Session Time: 1:30pm-2:30pm  Participation Level: Active  Behavioral Response: Well GroomedAlertAnxious, Depressed and Hopeless  Type of Therapy: Individual Therapy  Treatment Goals addressed: Improve Psychiatric Symptoms, elevate mood (increased self-esteem, increased self-compassion, increased interaction), improve unhelpful thought patterns, controlled behavior, moderate mood, deliberate speech and thought process(improved social functioning, healthy adjustment to living situation), Learn about diagnosis, healthy coping skills  Interventions: Motivational Interviewing, CBT, Grounding & Mindfulness Techniques, psychoeducation  Summary: Verdene Creson is a 72 y.o. female who presents with Bipolar I Disorder, depressed and Generalized Anxiety Disorder.   Suicidal/Homicidal: No - without intent/plan  Therapist Response: [name] met with clinician for an individual session.  Laketa discussed her psychiatric symptoms, her current life events and her homework. Jalexus shared that since her last session, she has felt increasingly depressed and anxious, not getting dressed or showered for 2-3 days in a row, not wanting to leave her apartment, reduction in appetite, weight loss, and significant fear and anxiety that resulted in full body tremors. In session, Jennah presented tearfully, sensitive to lights, and extremely shaky. She had difficulty focusing. Clinician discussed status, including any suicidal thoughts. Verl denied active suicidal intent, but reported not wanting to be alive. Clinician discussed relationship with husband and her experiences in this relationship, both in the past and currently. She identified the feeling of "wasting her life" in that marriage because she never had her emotional needs met.   Clinician discussed concerns about medication and asked Dr. Doyne Keel to speak with [name] in session. Dr. Doyne Keel reviewed Dr. Marguerite Olea notes and discussed current  medications. Dr. Doyne Keel identified interest in increasing Lamictal from '50mg'$  to '75mg'$  for 2 weeks and then increasing to '100mg'$ .  Jeda agreed and requested medication to be filled at [facility] on [address].  Following session, Tiearra reported that she felt better, as she felt she would be able to actually "do something", rather than just talk about it.   Plan: Return again in 2-3 weeks  Diagnosis:     Axis I: Bipolar I Disorder, depressed and Generalized Anxiety Disorder.   Jobe Marker Bishop, LCSW 09/21/2017

## 2017-09-26 ENCOUNTER — Telehealth (HOSPITAL_COMMUNITY): Payer: Self-pay

## 2017-09-26 NOTE — Telephone Encounter (Signed)
Patient is to start taking 75mg  of Lamictal for 2 weeks then increase to 100 mgs

## 2017-09-28 ENCOUNTER — Other Ambulatory Visit (HOSPITAL_COMMUNITY): Payer: Self-pay | Admitting: Psychiatry

## 2017-09-28 DIAGNOSIS — F411 Generalized anxiety disorder: Secondary | ICD-10-CM

## 2017-09-28 MED ORDER — LAMOTRIGINE 100 MG PO TABS: ORAL_TABLET | ORAL | 0 refills | Status: DC

## 2017-09-28 NOTE — Telephone Encounter (Signed)
Pt is to start Lamictal 100mg  qD on 10/05/2017- script sent to pharmacy

## 2017-10-03 DIAGNOSIS — R194 Change in bowel habit: Secondary | ICD-10-CM | POA: Diagnosis not present

## 2017-10-03 DIAGNOSIS — Z86010 Personal history of colonic polyps: Secondary | ICD-10-CM | POA: Diagnosis not present

## 2017-10-03 DIAGNOSIS — K5900 Constipation, unspecified: Secondary | ICD-10-CM | POA: Diagnosis not present

## 2017-10-05 ENCOUNTER — Ambulatory Visit (INDEPENDENT_AMBULATORY_CARE_PROVIDER_SITE_OTHER): Payer: Medicare Other | Admitting: Licensed Clinical Social Worker

## 2017-10-05 ENCOUNTER — Encounter (HOSPITAL_COMMUNITY): Payer: Self-pay | Admitting: Licensed Clinical Social Worker

## 2017-10-05 ENCOUNTER — Other Ambulatory Visit (HOSPITAL_COMMUNITY): Payer: Self-pay

## 2017-10-05 DIAGNOSIS — F319 Bipolar disorder, unspecified: Secondary | ICD-10-CM

## 2017-10-05 DIAGNOSIS — F411 Generalized anxiety disorder: Secondary | ICD-10-CM

## 2017-10-05 MED ORDER — LAMOTRIGINE 100 MG PO TABS: ORAL_TABLET | ORAL | 0 refills | Status: DC

## 2017-10-05 NOTE — Progress Notes (Signed)
   THERAPIST PROGRESS NOTE  Session Time: 12:30pm-1:30pm  Participation Level: Active  Behavioral Response: Well GroomedAlertAnxious and Euthymic  Type of Therapy: Individual Therapy  Treatment Goals addressed: Improve Psychiatric Symptoms, elevate mood (increased self-esteem, increased self-compassion, increased interaction), improve unhelpful thought patterns, controlled behavior, moderate mood, deliberate speech and thought process(improved social functioning, healthy adjustment to living situation), Learn about diagnosis, healthy coping skills  Interventions: Motivational Interviewing, CBT, Grounding & Mindfulness Techniques, psychoeducation  Summary: Sandra Cox is a 72 y.o. female who presents with Bipolar I Disorder, depressed and Generalized Anxiety Disorder.   Suicidal/Homicidal: No - without intent/plan  Therapist Response: Sandra Cox met with Sandra Cox for an individual session.  Sandra Cox discussed her psychiatric symptoms, her current life events and her homework. Sandra Cox shared improvement in depression sxs about 4 days ago. She reports increase in medication was helpful and "just like that" she felt better. Sandra Cox identified indicated increase again to 100 mg Lamictal this evening and ensured that Sandra Cox had medication sent in to appropriate pharmacy. Sandra Cox discussed Sandra Cox's life and reviewed choices made. Sandra Cox processed Calaya's feelings about making the mistake of marrying her husband, having children, and staying in the relationship for 50 years. Sandra Cox also identified that she knew she made a mistake as she was saying her vows, and consequently began having Bipolar episodes immediately. Sandra Cox provided psychoeducation about mental illness and noted that often people begin sxs of Bipolar in their late teens-early 20s. [name] reported that she has bee "sick" for so many years and now she is able to see things more clearly that she is not in the relationship anymore.    Plan: Return  again in 2-3 weeks  Diagnosis:     Axis I: Bipolar I Disorder, depressed and Generalized Anxiety Disorder.   Jobe Marker [place], LCSW 10/05/2017

## 2017-10-11 ENCOUNTER — Encounter (HOSPITAL_COMMUNITY): Payer: Self-pay | Admitting: Psychiatry

## 2017-10-11 ENCOUNTER — Ambulatory Visit (INDEPENDENT_AMBULATORY_CARE_PROVIDER_SITE_OTHER): Payer: Medicare Other | Admitting: Psychiatry

## 2017-10-11 VITALS — BP 96/67 | HR 94 | Ht 65.25 in | Wt 123.0 lb

## 2017-10-11 DIAGNOSIS — F319 Bipolar disorder, unspecified: Secondary | ICD-10-CM | POA: Diagnosis not present

## 2017-10-11 DIAGNOSIS — Z79899 Other long term (current) drug therapy: Secondary | ICD-10-CM | POA: Diagnosis not present

## 2017-10-11 DIAGNOSIS — F5104 Psychophysiologic insomnia: Secondary | ICD-10-CM | POA: Diagnosis not present

## 2017-10-11 DIAGNOSIS — F411 Generalized anxiety disorder: Secondary | ICD-10-CM | POA: Diagnosis not present

## 2017-10-11 MED ORDER — LORAZEPAM 0.5 MG PO TABS: 0.5000 mg | ORAL_TABLET | Freq: Every day | ORAL | 1 refills | Status: DC

## 2017-10-11 NOTE — Progress Notes (Signed)
[place] MD/PA/NP OP Progress Note  10/11/2017 10:41 AM Sandra Cox  MRN:  [id number]  Chief Complaint: I called to have my medications changed 2 weeks ago due to worsening depression.  I was changed to Lamictal 75 mg and now I am at 100 mg.  I have been taking 2 of my Effexor pills.   HPI: .Sandra Cox is a 72 y.o. female, Caucasian female with a history of bipolar disorder and had been on Lithium since 1988.  Since establishing care with [facility], 06/2017 she has been tapered off Latuda and lithium and started her on Lamictal.  She is also taking Vit D3 Seroquel and Effexor, but had some confusion, and believes she has been taking 300 mg daily since her last visit.  She uses Lorazepam 0.5 mg 3-4 times a week at night if she can not fall asleep. She reports the current medication is working well.  Overall she describes her mood is good.  She is feeling anxious today about her colonoscopy prep tomorrow and procedure on Friday.  She denies any paranoia, AV hallucination, mania, psychosis.  She denies drinking alcohol or using any illegal substances. Her appetite and energy are "good".  She denies SI or HI.   Visit Diagnosis:    ICD-10-CM   1. Bipolar I disorder ([place]) F31.9   2. GAD (generalized anxiety disorder) F41.1   3. 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 Encounter for medication management [phone]     Past Psychiatric History: Reviewed.  Past Medical History:  Past Medical History:  Diagnosis Date  . Bipolar disorder [facility])      She recently seen Wells Guiles Tat for tremors but there were no new medication.  Her tremors are much better since we discontinue Latuda and lithium.  Past Surgical History:  Procedure Laterality Date  . FACELIFT, LOWER 2/3  2014    Family Psychiatric History: Reviewed.  Family History:  Family History  Problem Relation Age of Onset  . Stroke Mother   . Heart attack Father   . Healthy Daughter     Social History:  She is separated from her husband after 76  years of marriage.  She admitted her husband was very controlling and demanding.   Social History   Socioeconomic History  . Marital status: Married    Spouse name: Not on file  . Number of children: 2  . Years of education: Not on file  . Highest education level: Bachelor's degree (e.g., BA, AB, BS)  Occupational History  . Occupation: retired    Comment: Pharmacist, hospital, 3rd grade x 7 years  Social Needs  . Financial resource strain: Not hard at all  . Food insecurity:    Worry: Never true    Inability: Never true  . Transportation needs:    Medical: No    Non-medical: No  Tobacco Use  . Smoking status: Never Smoker  . Smokeless tobacco: Never Used  Substance and Sexual Activity  . Alcohol use: No  . Drug use: No  . Sexual activity: Not Currently  Lifestyle  . Physical activity:    Days per week: 0 days    Minutes per session: 0 min  . Stress: To some extent  Relationships  . Social connections:    Talks on phone: Not on file    Gets together: Not on file    Attends religious service: Not on file    Active member of club or organization: Not on file    Attends meetings of clubs or  organizations: Not on file    Relationship status: Not on file  Other Topics Concern  . Not on file  Social History Narrative  . Not on file    Allergies: No Known Allergies  Metabolic Disorder Labs: No results found for: HGBA1C, MPG No results found for: PROLACTIN No results found for: CHOL, TRIG, HDL, CHOLHDL, VLDL, LDLCALC Lab Results  Component Value Date   TSH 0.82 07/04/2017    Therapeutic Level Labs: No results found for this or any previous visit (from the past 2160 hour(s)). No results found for: LITHIUM No results found for: VALPROATE No components found for:  CBMZ  Current Medications: Current Outpatient Medications  Medication Sig Dispense Refill  . lamoTRIgine (LAMICTAL) 100 MG tablet Take one tab daily starting on 10/05/2017 90 tablet 0  . LORazepam (ATIVAN) 0.5 MG  tablet Take 1 tablet (0.5 mg total) by mouth daily. 30 tablet 1  . nadolol (CORGARD) 20 MG tablet Take 20 mg by mouth 2 (two) times daily.    . QUEtiapine (SEROQUEL) 100 MG tablet Take 1 tablet (100 mg total) by mouth at bedtime. 90 tablet 0  . venlafaxine XR (EFFEXOR-XR) 150 MG 24 hr capsule Take 1 capsule (150 mg total) by mouth daily with breakfast. 90 capsule 0   No current facility-administered medications for this visit.      Musculoskeletal: Strength & Muscle Tone: within normal limits Gait & Station: normal Patient leans: N/A  Psychiatric Specialty Exam: Review of Systems  Constitutional: Negative.   HENT: Negative.   Respiratory: Negative.   Genitourinary: Negative.   Musculoskeletal: Negative.   Skin: Negative.   Psychiatric/Behavioral: Negative for depression, hallucinations, memory loss, substance abuse and suicidal ideas. The patient is nervous/anxious. The patient does not have insomnia.     Blood pressure 96/67, pulse 94, height 5' 5.25" (1.657 m), weight 123 lb (55.8 kg), SpO2 96 %.Body mass index is 20.31 kg/m.  General Appearance: Well Groomed  Eye Contact:  Good  Speech:  Clear and Coherent and Normal Rate  Volume:  Normal  Mood:  Anxious and Euthymic  Affect:  Congruent  Thought Process:  Coherent, Goal Directed, Linear and Descriptions of Associations: Intact  Orientation:  Full (Time, Place, and Person)  Thought Content: Logical and Hallucinations: None   Suicidal Thoughts:  No  Homicidal Thoughts:  No  Memory:  Immediate;   Good Recent;   Good Remote;   Good  Judgement:  Good  Insight:  Good  Psychomotor Activity:  Normal  Concentration:  Concentration: Fair and Attention Span: Fair  Recall:  [employer] of Knowledge: Good  Language: Good  Akathisia:  No  Handed:  Right  AIMS (if indicated): not done  Assets:  Communication Skills Desire for [place] Talents/Skills  ADL's:  Intact   Cognition: WNL  Sleep:  Good   Screenings: PHQ2-9     Office Visit from 07/02/2016 in Primary Care at [facility] Total Score  0       Assessment and Plan: Bipolar disorder type I.  Generalized anxiety disorder. Psychophysiologic insomnia  Patient is happy with current medications and is agreeable to ongoing treatment: Continue Lamictal 50 mg daily, Effexor XR 150 mg daily, Seroquel 100 mg at bedtime and lorazepam 0.5 mg as needed.  Encouraged to keep appointments with a therapist.   Follow-up in 3 months.Time spent 25 minutes.  More than 50% of the time spent in psychoeducation, counseling and coordination of care.  Discuss safety plan that anytime having active suicidal thoughts or homicidal thoughts then patient need to call 911 or go to the local emergency room.    Lavella Hammock, MD 10/11/2017, 10:41 AM

## 2017-10-12 ENCOUNTER — Other Ambulatory Visit: Payer: Self-pay | Admitting: Internal Medicine

## 2017-10-12 ENCOUNTER — Ambulatory Visit
Admission: RE | Admit: 2017-10-12 | Discharge: 2017-10-12 | Disposition: A | Discharge status: Home / Self Care | Payer: Medicare Other | Source: Ambulatory Visit | Attending: Internal Medicine | Admitting: Internal Medicine

## 2017-10-12 DIAGNOSIS — M25572 Pain in left ankle and joints of left foot: Secondary | ICD-10-CM

## 2017-10-12 DIAGNOSIS — S99912A Unspecified injury of left ankle, initial encounter: Secondary | ICD-10-CM | POA: Diagnosis not present

## 2017-10-12 DIAGNOSIS — M7989 Other specified soft tissue disorders: Secondary | ICD-10-CM | POA: Diagnosis not present

## 2017-10-13 DIAGNOSIS — R194 Change in bowel habit: Secondary | ICD-10-CM | POA: Diagnosis not present

## 2017-10-19 ENCOUNTER — Ambulatory Visit (HOSPITAL_COMMUNITY): Payer: Medicare Other | Admitting: Licensed Clinical Social Worker

## 2017-11-02 ENCOUNTER — Encounter (HOSPITAL_COMMUNITY): Payer: Self-pay | Admitting: Licensed Clinical Social Worker

## 2017-11-02 ENCOUNTER — Ambulatory Visit (INDEPENDENT_AMBULATORY_CARE_PROVIDER_SITE_OTHER): Payer: Medicare Other | Admitting: Licensed Clinical Social Worker

## 2017-11-02 DIAGNOSIS — F411 Generalized anxiety disorder: Secondary | ICD-10-CM | POA: Diagnosis not present

## 2017-11-02 DIAGNOSIS — F319 Bipolar disorder, unspecified: Secondary | ICD-10-CM | POA: Diagnosis not present

## 2017-11-02 NOTE — Progress Notes (Signed)
   THERAPIST PROGRESS NOTE  Session Time: 12:30pm-1:30pm  Participation Level: Active  Behavioral Response: Well GroomedAlertEuthymic  Type of Therapy: Individual Therapy  Treatment Goals addressed: Improve Psychiatric Symptoms, elevate mood (increased self-esteem, increased self-compassion, increased interaction), improve unhelpful thought patterns, controlled behavior, moderate mood, deliberate speech and thought process(improved social functioning, healthy adjustment to living situation), Learn about diagnosis, healthy coping skills  Interventions: Motivational Interviewing, CBT, Grounding & Mindfulness Techniques, psychoeducation  Summary: Sandra Cox is a 72 y.o. female who presents with Bipolar I Disorder, depressed and Generalized Anxiety Disorder.   Suicidal/Homicidal: No - without intent/plan  Therapist Response: Sandra Cox met with clinician for an individual session.  Sandra Cox discussed her psychiatric symptoms, her current life events and her homework. Sandra Cox shared relief that her colonoscopy was completed and that she did not have any polyps. She reports that the prep was so horrible that she will never have another one. Clinician processed mood and identified ongoing improvement. Sandra Cox reported that she has come to realize that she has to take control of her thoughts and be responsible for her life and happiness. Sandra Cox reports she never realized how she could change her thoughts and control her emotions in the past. Clinician discussed interactions at her retirement community and noted increased socialization, meeting more people, and making the effort to make plans with them. She also identified increased hugging of friends. Clinician processed thoughts and feelings about hugging and comfort being physically affectionate with others. Sandra Cox identified lack of hugging in her childhood home and lack of interest in hugging with her husband.    Plan: Return again in 2-3 weeks  Diagnosis:      Axis I: Bipolar I Disorder, depressed and Generalized Anxiety Disorder.     Jobe Marker [place], LCSW 11/02/2017

## 2017-11-10 DIAGNOSIS — L821 Other seborrheic keratosis: Secondary | ICD-10-CM | POA: Diagnosis not present

## 2017-11-10 DIAGNOSIS — L570 Actinic keratosis: Secondary | ICD-10-CM | POA: Diagnosis not present

## 2017-11-10 DIAGNOSIS — Z85828 Personal history of other malignant neoplasm of skin: Secondary | ICD-10-CM | POA: Diagnosis not present

## 2017-11-10 DIAGNOSIS — L400 Psoriasis vulgaris: Secondary | ICD-10-CM | POA: Diagnosis not present

## 2017-11-10 DIAGNOSIS — D2272 Melanocytic nevi of left lower limb, including hip: Secondary | ICD-10-CM | POA: Diagnosis not present

## 2017-11-15 DIAGNOSIS — R208 Other disturbances of skin sensation: Secondary | ICD-10-CM | POA: Diagnosis not present

## 2017-12-04 ENCOUNTER — Ambulatory Visit (INDEPENDENT_AMBULATORY_CARE_PROVIDER_SITE_OTHER): Payer: Medicare Other | Admitting: Licensed Clinical Social Worker

## 2017-12-04 ENCOUNTER — Encounter (HOSPITAL_COMMUNITY): Payer: Self-pay | Admitting: Licensed Clinical Social Worker

## 2017-12-04 DIAGNOSIS — F411 Generalized anxiety disorder: Secondary | ICD-10-CM

## 2017-12-04 DIAGNOSIS — F319 Bipolar disorder, unspecified: Secondary | ICD-10-CM

## 2017-12-04 NOTE — Progress Notes (Signed)
   THERAPIST PROGRESS NOTE  Session Time: 10:00am-11:00am  Participation Level: Active  Behavioral Response: Well GroomedAlertEuthymic  Type of Therapy: Individual Therapy  Treatment Goals addressed: Improve Psychiatric Symptoms, elevate mood (increased self-esteem, increased self-compassion, increased interaction), improve unhelpful thought patterns, controlled behavior, moderate mood, deliberate speech and thought process(improved social functioning, healthy adjustment to living situation), Learn about diagnosis, healthy coping skills  Interventions: Motivational Interviewing, CBT, Grounding & Mindfulness Techniques, psychoeducation  Summary: Sandra Cox is a 72 y.o. female who presents with Bipolar I Disorder, depressed and Generalized Anxiety Disorder.   Suicidal/Homicidal: No - without intent/plan  Therapist Response: Sandra Cox met with clinician for an individual session.  Sandra Cox discussed her psychiatric symptoms, her current life events and her homework. [name] shared that her medication has been doing well and she is overall feeling much more like herself. However, she continues to feel lonely and frustrated due to having to be the one to make the effort for socializing in her community. Clinician explored examples of this and noted that Sandra Cox has been making efforts to engage others. However, it is difficult for her to really make close friends due to the age differences. Clinician discussed Sandra Cox's interests and activities she may enjoy in her community. Clinician also provided information about the [facility]. 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    Plan: Return again in 2-3 weeks  Diagnosis:     Axis I: Bipolar I Disorder, depressed and Generalized Anxiety Disorder.    Jobe Marker [place], LCSW 12/04/2017

## 2017-12-18 ENCOUNTER — Ambulatory Visit (HOSPITAL_COMMUNITY): Payer: Medicare Other | Admitting: Licensed Clinical Social Worker

## 2017-12-28 ENCOUNTER — Other Ambulatory Visit: Payer: Self-pay | Admitting: Internal Medicine

## 2017-12-28 DIAGNOSIS — M810 Age-related osteoporosis without current pathological fracture: Secondary | ICD-10-CM | POA: Diagnosis not present

## 2017-12-28 DIAGNOSIS — E7800 Pure hypercholesterolemia, unspecified: Secondary | ICD-10-CM | POA: Diagnosis not present

## 2017-12-28 DIAGNOSIS — G250 Essential tremor: Secondary | ICD-10-CM | POA: Diagnosis not present

## 2017-12-28 DIAGNOSIS — F319 Bipolar disorder, unspecified: Secondary | ICD-10-CM | POA: Diagnosis not present

## 2017-12-28 DIAGNOSIS — R636 Underweight: Secondary | ICD-10-CM | POA: Diagnosis not present

## 2018-01-01 ENCOUNTER — Ambulatory Visit (HOSPITAL_COMMUNITY): Payer: Medicare Other | Admitting: Licensed Clinical Social Worker

## 2018-01-02 ENCOUNTER — Ambulatory Visit (INDEPENDENT_AMBULATORY_CARE_PROVIDER_SITE_OTHER): Payer: Medicare Other | Admitting: Psychiatry

## 2018-01-02 VITALS — BP 116/68 | Ht 65.5 in | Wt 123.4 lb

## 2018-01-02 DIAGNOSIS — F411 Generalized anxiety disorder: Secondary | ICD-10-CM | POA: Diagnosis not present

## 2018-01-02 DIAGNOSIS — F319 Bipolar disorder, unspecified: Secondary | ICD-10-CM

## 2018-01-02 MED ORDER — QUETIAPINE FUMARATE 100 MG PO TABS: 100.0000 mg | ORAL_TABLET | Freq: Every day | ORAL | 0 refills | Status: DC

## 2018-01-02 MED ORDER — LORAZEPAM 0.5 MG PO TABS: 0.5000 mg | ORAL_TABLET | Freq: Every day | ORAL | 1 refills | Status: DC

## 2018-01-02 MED ORDER — LAMOTRIGINE 100 MG PO TABS: ORAL_TABLET | ORAL | 0 refills | Status: DC

## 2018-01-02 MED ORDER — VENLAFAXINE HCL ER 150 MG PO CP24: 150.0000 mg | ORAL_CAPSULE | Freq: Every day | ORAL | 0 refills | Status: DC

## 2018-01-02 NOTE — Progress Notes (Signed)
[place] MD/PA/NP OP Progress Note  01/02/2018 3:31 PM Sandra Cox  MRN:  [id number]  Chief Complaint: I am feeling better since Lamictal dose increased.  HPI: Patient came for her follow-up appointment.  She was seen by covering physician in my absence.  Her Lamictal was increased and she is not taking 100 mg.  She has no rash, itching, tremors or shakes.  She used to take Latuda and lithium which was discontinued when patient complained about tremors shakes.  She saw a neurologist for the tremors but did not find any etiology at that time.  Even though she is feeling better but she started to have tremors again.  She is not sure what causes the tremors but she is taking nadolol which helps her tremors.  Recently her primary care physician Dr. Kelton Pillar started her on pravastatin for high cholesterol.  She feel less depressed and less irritable.  She diagnosed with bipolar and recently she has no mania or any irritability.  She is separated from her husband for past 3 years.  However they have a very good friendship and her husband is coming to visit her on Thanksgiving.  Patient is sleeping better.  She takes half Ativan as needed.  She denies any crying spells, irritability, feeling of hopelessness or worthlessness.  She denies any suicidal thoughts.  She has no rash, itching, tremors or shakes.  We are still awaiting records from [facility] and her primary care physician who recently did blood work on her.  Seeing Janett Billow for therapy.  Visit Diagnosis:    ICD-10-CM   1. Bipolar I disorder (HCC) F31.9 QUEtiapine (SEROQUEL) 100 MG tablet  2. GAD (generalized anxiety disorder) F41.1 venlafaxine XR (EFFEXOR-XR) 150 MG 24 hr capsule    lamoTRIgine (LAMICTAL) 100 MG tablet    LORazepam (ATIVAN) 0.5 MG tablet    DISCONTINUED: LORazepam (ATIVAN) 0.5 MG tablet    Past Psychiatric History: Reviewed. History of depression since 10s.  Saw psychiatrist in [place] and then moved to [place] and then  moved to [place] in a retirement home.  Saw Lattie Haw Pullis had tried psychiatry and prescribed Seroquel and Latuda together.  Also took Zoloft that did not work and lithium that cause tremors.  No history of psychiatric admission treatment or any suicidal attempt.  History of mania and rapid cycling 3-4 times a year. Past Medical History:  Past Medical History:  Diagnosis Date  . Bipolar disorder [facility])     Past Surgical History:  Procedure Laterality Date  . FACELIFT, LOWER 2/3  2014    Family Psychiatric History: Reviewed.  Family History:  Family History  Problem Relation Age of Onset  . Stroke Mother   . Heart attack Father   . Healthy Daughter     Social History:  Social History   Socioeconomic History  . Marital status: Married    Spouse name: Not on file  . Number of children: 2  . Years of education: Not on file  . Highest education level: Bachelor's degree (e.g., BA, AB, BS)  Occupational History  . Occupation: retired    Comment: Pharmacist, hospital, 3rd grade x 7 years  Social Needs  . Financial resource strain: Not hard at all  . Food insecurity:    Worry: Never true    Inability: Never true  . Transportation needs:    Medical: No    Non-medical: No  Tobacco Use  . Smoking status: Never Smoker  . Smokeless tobacco: Never Used  Substance and Sexual Activity  .  Alcohol use: No  . Drug use: No  . Sexual activity: Not Currently  Lifestyle  . Physical activity:    Days per week: 0 days    Minutes per session: 0 min  . Stress: To some extent  Relationships  . Social connections:    Talks on phone: Not on file    Gets together: Not on file    Attends religious service: Not on file    Active member of club or organization: Not on file    Attends meetings of clubs or organizations: Not on file    Relationship status: Not on file  Other Topics Concern  . Not on file  Social History Narrative  . Not on file    Allergies: No Known Allergies  Metabolic Disorder  Labs: No results found for: HGBA1C, MPG No results found for: PROLACTIN No results found for: CHOL, TRIG, HDL, CHOLHDL, VLDL, LDLCALC Lab Results  Component Value Date   TSH 0.82 07/04/2017    Therapeutic Level Labs: No results found for: LITHIUM No results found for: VALPROATE No components found for:  CBMZ  Current Medications: Current Outpatient Medications  Medication Sig Dispense Refill  . lamoTRIgine (LAMICTAL) 100 MG tablet Take one tab daily starting on 10/05/2017 90 tablet 0  . LORazepam (ATIVAN) 0.5 MG tablet Take 1 tablet (0.5 mg total) by mouth daily. 30 tablet 1  . nadolol (CORGARD) 20 MG tablet Take 20 mg by mouth 2 (two) times daily.    . QUEtiapine (SEROQUEL) 100 MG tablet Take 1 tablet (100 mg total) by mouth at bedtime. 90 tablet 0  . venlafaxine XR (EFFEXOR-XR) 150 MG 24 hr capsule Take 1 capsule (150 mg total) by mouth daily with breakfast. 90 capsule 0   No current facility-administered medications for this visit.      Musculoskeletal: Strength & Muscle Tone: within normal limits Gait & Station: normal Patient leans: N/A  Psychiatric Specialty Exam: Review of Systems  Skin: Negative.  Negative for itching and rash.  Neurological: Positive for tremors.    Blood pressure 116/68, height 5' 5.5" (1.664 m), weight 123 lb 6.4 oz (56 kg).There is no height or weight on file to calculate BMI.  General Appearance: Neat and Well Groomed  Eye Contact:  Good  Speech:  Fast but clear and coherent  Volume:  Normal  Mood:  Anxious  Affect:  Congruent  Thought Process:  Descriptions of Associations: Intact  Orientation:  Full (Time, Place, and Person)  Thought Content: Rumination   Suicidal Thoughts:  No  Homicidal Thoughts:  No  Memory:  Immediate;   Good Recent;   Good Remote;   Good  Judgement:  Good  Insight:  Good  Psychomotor Activity:  Tremor  Concentration:  Concentration: Fair and Attention Span: Fair  Recall:  Good  Fund of Knowledge: Good   Language: Good  Akathisia:  No  Handed:  Right  AIMS (if indicated): not done  Assets:  Communication Skills Desire for Improvement Housing Resilience  ADL's:  Intact  Cognition: WNL  Sleep:  Good   Screenings: PHQ2-9     Office Visit from 07/02/2016 in Primary Care at [facility] Total Score  0       Assessment and Plan: Bipolar disorder type I.  Generalized anxiety disorder.  Patient doing better on her current medication.  Continue Effexor XR 150 mg daily, Seroquel 100 mg at bedtime, Lamictal 100 mg daily and lorazepam 0.5 mg as needed for anxiety and insomnia.  Encouraged to continue therapy with Janett Billow.  Patient has no rash, itching, tremors or shakes.  I recommended to see her primary care physician for refills on nadolol.  Patient reported that her tremors are much better with nadolol.  Recommended to call us back if she is any question or any concern.  Follow-up in 3 months.     Kathlee Nations, MD 01/02/2018, 3:31 PM

## 2018-01-02 NOTE — Progress Notes (Deleted)
Subjective:   Sandra Cox was seen in consultation in the movement disorder clinic at the request of Lavone Orn, MD.  The evaluation is for tremor.  The records that were made available to me were reviewed.  Tremor started approximately 4-5 years ago and started with the R hand with eating/writing.  It started in the L hand about a year ago.  There is no family hx of tremor.  Been on lithium since 1988.  Been on latuda for 2 months.  Been on seroquel for about a year.    Affected by caffeine:  Yes.   (1 cup coffee per day) Affected by alcohol:  Doesn't drink EtOH Affected by stress:  Yes.   Affected by fatigue:  No. Spills soup if on spoon:  No. Spills glass of liquid if full:  No. Affects ADL's (tying shoes, brushing teeth, etc):  No.  Current/Previously tried tremor medications: nadolol but didn't help tremor  Current medications that may exacerbate tremor:  was on lithium for bipolar depression but was d/c few weeks ago; was on latuda until few weeks ago; on seroquel  Reviewed psychiatry records.  The patient presented to Dr. Adele Schilder as a new patient on Jun 15, 2017.  When she came to him, she was taking lithium, 150 mg daily, Latuda, 40 mg daily, Seroquel 100 mg at bedtime, in addition to other antidepressants.  Dr. Adele Schilder noted that she was on 2 different antipsychotics and he recommended discontinuing the Latuda and continuing the Seroquel.  Lithium was also discontinued.  01/09/18 update: Patient is seen today in follow-up for tremor.  When I saw her last visit, she had just stopped lithium and Latuda, but remained on Seroquel.  We decided to take a wait and see approach.  Records have been reviewed.  She last saw psychiatry on October 11, 2017.  She remains off of lithium and Latuda.  She is on Lamictal.  She is still on bedtime Seroquel, 100 mg.  Outside reports reviewed: historical medical records, lab reports and referral letter/letters.  No Known Allergies  Outpatient  Encounter Medications as of 01/08/2018  Medication Sig  . lamoTRIgine (LAMICTAL) 100 MG tablet Take one tab daily starting on 10/05/2017  . LORazepam (ATIVAN) 0.5 MG tablet Take 1 tablet (0.5 mg total) by mouth daily.  . nadolol (CORGARD) 20 MG tablet Take 20 mg by mouth 2 (two) times daily.  . QUEtiapine (SEROQUEL) 100 MG tablet Take 1 tablet (100 mg total) by mouth at bedtime.  Marland Kitchen venlafaxine XR (EFFEXOR-XR) 150 MG 24 hr capsule Take 1 capsule (150 mg total) by mouth daily with breakfast.   No facility-administered encounter medications on file as of 01/08/2018.     Past Medical History:  Diagnosis Date  . Bipolar disorder [facility])     Past Surgical History:  Procedure Laterality Date  . FACELIFT, LOWER 2/3  2014    Social History   Socioeconomic History  . Marital status: Married    Spouse name: Not on file  . Number of children: 2  . Years of education: Not on file  . Highest education level: Bachelor's degree (e.g., BA, AB, BS)  Occupational History  . Occupation: retired    Comment: Pharmacist, hospital, 3rd grade x 7 years  Social Needs  . Financial resource strain: Not hard at all  . Food insecurity:    Worry: Never true    Inability: Never true  . Transportation needs:    Medical: No    Non-medical: No  Tobacco Use  . Smoking status: Never Smoker  . Smokeless tobacco: Never Used  Substance and Sexual Activity  . Alcohol use: No  . Drug use: No  . Sexual activity: Not Currently  Lifestyle  . Physical activity:    Days per week: 0 days    Minutes per session: 0 min  . Stress: To some extent  Relationships  . Social connections:    Talks on phone: Not on file    Gets together: Not on file    Attends religious service: Not on file    Active member of club or organization: Not on file    Attends meetings of clubs or organizations: Not on file    Relationship status: Not on file  . Intimate partner violence:    Fear of current or ex partner: Not on file    Emotionally  abused: Not on file    Physically abused: Not on file    Forced sexual activity: Not on file  Other Topics Concern  . Not on file  Social History Narrative  . Not on file    Family Status  Relation Name Status  . Mother  Deceased  . Father  Deceased  . Daughter x2 Alive    Review of Systems A complete 10 system ROS was obtained and was negative apart from what is mentioned.   Objective:   VITALS:   There were no vitals filed for this visit. Gen:  Appears stated age and in NAD. HEENT:  Normocephalic, atraumatic. The mucous membranes are moist. The superficial temporal arteries are without ropiness or tenderness. Cardiovascular: Regular rate and rhythm. Lungs: Clear to auscultation bilaterally. Neck: There are no carotid bruits noted bilaterally.  NEUROLOGICAL:  Orientation:  The patient is alert and oriented x 3.  Recent and remote memory are intact.  Attention span and concentration are normal.  Able to name objects and repeat without trouble.  Fund of knowledge is appropriate Cranial nerves: There is good facial symmetry. There is mild facial hypomimia.   Extraocular muscles are intact and visual fields are full to confrontational testing. Speech is fluent and clear. Soft palate rises symmetrically and there is no tongue deviation. Hearing is intact to conversational tone. Tone: Tone is good throughout. Sensation: Sensation is intact to light touch and pinprick throughout (facial, trunk, extremities). Vibration is intact at the bilateral big toe but it is slightly decreased. There is no extinction with double simultaneous stimulation. There is no sensory dermatomal level identified. Coordination:  The patient has no dysdiadichokinesia or dysmetria. Motor: Strength is 5/5 in the bilateral upper and lower extremities.  Shoulder shrug is equal bilaterally.  There is no pronator drift.  There are no fasciculations noted. DTR's: Deep tendon reflexes are 3-3+/4 at the bilateral  biceps, triceps, brachioradialis, patella and achilles.  Plantar responses are downgoing bilaterally. Gait and Station: The patient is able to ambulate without difficulty. The patient is able to heel toe walk without any difficulty. The patient is able to ambulate in a tandem fashion. The patient is able to stand in the Romberg position.   MOVEMENT EXAM: Tremor:    There is LLE resting tremor and rare resting tremor in the thumb.  There is mouth/chin tremor.   Archimedes spirals with mild tremor on the L only.  She has intention tremor.  When the patient is asked able to pour water from one glass to another, she doesn't spill it but tremor is noted.  Lab Results  Component Value  Date   TSH 0.82 07/04/2017    I was able to get a copy of her lab work from her primary care physician dated December 08, 2016.  White blood cells are 5.8, hemoglobin 12.4, hematocrit 37.6 and platelets 180.  Sodium was 139, potassium 4.7, chloride 107, CO2 28, BUN 23, creatinine 1.35.  Glucose was 95.  AST 16, ALT 13, alkaline phosphatase 56.   Assessment/Plan:   1.  Tremor  -etiology unknown but suspect that meds are contributing source, if not primary.  Her tremor is mixed rest and intention. When pt first presented to Dr. Adele Schilder a few weeks ago, she was on lithium and 2 different antipsychotics.  Latuda and lithium were d/c (both well known sources of tremor).  She is still on seroquel.  Discussed that if due to antipsychotics it can take up to 6 months after d/c to see if due to medication (and she is still on seroquel).  Wouldn't recommend medication at this time and she agrees.  If still with tremor in 6 months (and I suspect she may), will consider DaT scan.  She and I discussed this today.  She is mildly parkinsonian, but the antipsychotics can cause this as well.   CC:  Lavone Orn, MD

## 2018-01-03 ENCOUNTER — Other Ambulatory Visit: Payer: Self-pay | Admitting: Internal Medicine

## 2018-01-03 DIAGNOSIS — M810 Age-related osteoporosis without current pathological fracture: Secondary | ICD-10-CM

## 2018-01-08 ENCOUNTER — Ambulatory Visit: Payer: Medicare Other | Admitting: Neurology

## 2018-01-08 DIAGNOSIS — F319 Bipolar disorder, unspecified: Secondary | ICD-10-CM | POA: Diagnosis not present

## 2018-01-08 DIAGNOSIS — H2513 Age-related nuclear cataract, bilateral: Secondary | ICD-10-CM | POA: Diagnosis not present

## 2018-01-08 DIAGNOSIS — H903 Sensorineural hearing loss, bilateral: Secondary | ICD-10-CM | POA: Diagnosis not present

## 2018-01-08 DIAGNOSIS — Z23 Encounter for immunization: Secondary | ICD-10-CM | POA: Diagnosis not present

## 2018-01-30 ENCOUNTER — Ambulatory Visit
Admission: RE | Admit: 2018-01-30 | Discharge: 2018-01-30 | Disposition: A | Discharge status: Home / Self Care | Payer: Medicare Other | Source: Ambulatory Visit | Attending: Internal Medicine | Admitting: Internal Medicine

## 2018-01-30 DIAGNOSIS — Z780 Asymptomatic menopausal state: Secondary | ICD-10-CM | POA: Diagnosis not present

## 2018-01-30 DIAGNOSIS — M810 Age-related osteoporosis without current pathological fracture: Secondary | ICD-10-CM

## 2018-01-30 DIAGNOSIS — M85851 Other specified disorders of bone density and structure, right thigh: Secondary | ICD-10-CM | POA: Diagnosis not present

## 2018-02-06 ENCOUNTER — Encounter (HOSPITAL_COMMUNITY): Payer: Self-pay | Admitting: Licensed Clinical Social Worker

## 2018-02-06 ENCOUNTER — Ambulatory Visit (INDEPENDENT_AMBULATORY_CARE_PROVIDER_SITE_OTHER): Payer: Medicare Other | Admitting: Licensed Clinical Social Worker

## 2018-02-06 DIAGNOSIS — F319 Bipolar disorder, unspecified: Secondary | ICD-10-CM

## 2018-02-06 NOTE — Progress Notes (Signed)
   THERAPIST PROGRESS NOTE  Session Time: 4:30pm-5:15pm  Participation Level: Active  Behavioral Response: Well GroomedAlertEuthymic  Type of Therapy: Individual Therapy  Treatment Goals addressed: "I don't want to feel as nervous and to decrease my depression".   Interventions: Motivational Interviewing, CBT, Grounding & Mindfulness Techniques, psychoeducation  Summary: Cyara Devoto is a 72 y.o. female who presents with Bipolar I Disorder, depressed and Generalized Anxiety Disorder.   Suicidal/Homicidal: No - without intent/plan  Therapist Response: [name] met with clinician for an individual session.  Maryah discussed her psychiatric symptoms, her current life events and her homework. Kamiryn questioned when her last severe depressive episode occurred and noted it had been the middle of July-middle of August. Clinician explored current status and identified that Keandria is feeling wonderful. She was smiling and laughing, more engaged socially with people at her community, and feeling more confident. Clinician explored the changes and noted that Kahlia gets to be exactly who she is and who she wants to be now that she and her husband are separated. She reports their relationship is wonderful and they are excellent friends. Clinician explored the details of the relationship with husband's girlfriend and noted that they too are great friends and get along well. She reports she is having cataract surgery in 10 days and both husband and his girlfriend will come take care of her. Clinician identified the rarity of this relationship and encouraged Zahria to keep doing what she is doing in order to maintain the positive mood and energy.   Plan: Return again in 6 weeks  Diagnosis:     Axis I: Bipolar I Disorder, depressed and Generalized Anxiety Disorder.   Jobe Marker [place], LCSW 02/06/2018

## 2018-02-15 DIAGNOSIS — H2511 Age-related nuclear cataract, right eye: Secondary | ICD-10-CM | POA: Diagnosis not present

## 2018-02-15 DIAGNOSIS — H25811 Combined forms of age-related cataract, right eye: Secondary | ICD-10-CM | POA: Diagnosis not present

## 2018-02-19 NOTE — Progress Notes (Signed)
Subjective:   Sandra Cox was seen in consultation in the movement disorder clinic at the request of Lavone Orn, MD.  The evaluation is for tremor.  The records that were made available to me were reviewed.  Tremor started approximately 4-5 years ago and st arted with the R hand with eating/writing.  It started in the L hand about a year ago.  There is no family hx of tremor.  Been on lithium since 1988.  Been on latuda for 2 months.  Been on seroquel for about a year.    Affected by caffeine:  Yes.   (1 cup coffee per day) Affected by alcohol:  Doesn't drink EtOH Affected by stress:  Yes.   Affected by fatigue:  No. Spills soup if on spoon:  No. Spills glass of liquid if full:  No. Affects ADL's (tying shoes, brushing teeth, etc):  No.  Current/Previously tried tremor medications: nadolol but didn't help tremor  Current medications that may exacerbate tremor:  was on lithium for bipolar depression but was d/c few weeks ago; was on latuda until few weeks ago; on seroquel  Reviewed psychiatry records.  The patient presented to Dr. Adele Schilder as a new patient on Jun 15, 2017.  When she came to him, she was taking lithium, 150 mg daily, Latuda, 40 mg daily, Seroquel 100 mg at bedtime, in addition to other antidepressants.  Dr. Adele Schilder noted that she was on 2 different antipsychotics and he recommended discontinuing the Latuda and continuing the Seroquel.  Lithium was also discontinued.  Outside reports reviewed: historical medical records, lab reports and referral letter/letters.  02/20/18 update:  Pt seen in f/u for tremor.  Last visit, when I saw the patient, her psychiatrist had just d/c the latuda and lithium and left her on the seroquel, which she is still on.  The records that were made available to me were reviewed.  Last saw psychiatry on 01/02/18.  Stated that she was on nadolol for tremor but only takes that prn and hasn't need that for tremor in a while.   She states today that she has  been doing great.  Depression is under great control.  No tremor.  She is pleased.  No Known Allergies  Outpatient Encounter Medications as of 02/20/2018  Medication Sig  . clobetasol (OLUX) 0.05 % topical foam   . lamoTRIgine (LAMICTAL) 25 MG tablet Take 50 mg by mouth daily.  Marland Kitchen LORazepam (ATIVAN) 0.5 MG tablet Take 1 tablet (0.5 mg total) by mouth daily.  . nadolol (CORGARD) 20 MG tablet Take 20 mg by mouth 2 (two) times daily.  . pravastatin (PRAVACHOL) 40 MG tablet TK 1 T PO QD  . QUEtiapine (SEROQUEL) 100 MG tablet Take 1 tablet (100 mg total) by mouth at bedtime. (Patient taking differently: Take 50 mg by mouth at bedtime. )  . venlafaxine XR (EFFEXOR-XR) 150 MG 24 hr capsule Take 1 capsule (150 mg total) by mouth daily with breakfast.  . [DISCONTINUED] lamoTRIgine (LAMICTAL) 100 MG tablet Take one tab daily starting on 10/05/2017   No facility-administered encounter medications on file as of 02/20/2018.     Past Medical History:  Diagnosis Date  . Bipolar disorder [facility])     Past Surgical History:  Procedure Laterality Date  . FACELIFT, LOWER 2/3  2014    Social History   Socioeconomic History  . Marital status: Married    Spouse name: Not on file  . Number of children: 2  . Years of education:  Not on file  . Highest education level: Bachelor's degree (e.g., BA, AB, BS)  Occupational History  . Occupation: retired    Comment: Pharmacist, hospital, 3rd grade x 7 years  Social Needs  . Financial resource strain: Not hard at all  . Food insecurity:    Worry: Never true    Inability: Never true  . Transportation needs:    Medical: No    Non-medical: No  Tobacco Use  . Smoking status: Never Smoker  . Smokeless tobacco: Never Used  Substance and Sexual Activity  . Alcohol use: No  . Drug use: No  . Sexual activity: Not Currently  Lifestyle  . Physical activity:    Days per week: 0 days    Minutes per session: 0 min  . Stress: To some extent  Relationships  . Social  connections:    Talks on phone: Not on file    Gets together: Not on file    Attends religious service: Not on file    Active member of club or organization: Not on file    Attends meetings of clubs or organizations: Not on file    Relationship status: Not on file  . Intimate partner violence:    Fear of current or ex partner: Not on file    Emotionally abused: Not on file    Physically abused: Not on file    Forced sexual activity: Not on file  Other Topics Concern  . Not on file  Social History Narrative  . Not on file    Family Status  Relation Name Status  . Mother  Deceased  . Father  Deceased  . Daughter x2 Alive    Review of Systems A complete 10 system ROS was obtained and was negative apart from what is mentioned.   Objective:   VITALS:   Vitals:   02/20/18 0817  BP: 90/60  Pulse: 94  SpO2: 97%  Weight: 124 lb (56.2 kg)  Height: 5' 5.5" (1.664 m)   GEN:  The patient appears stated age and is in NAD. HEENT:  Normocephalic, atraumatic.  The mucous membranes are moist. The superficial temporal arteries are without ropiness or tenderness. CV:  RRR Lungs:  CTAB Neck/HEME:  There are no carotid bruits bilaterally.  Neurological examination:  Orientation: The patient is alert and oriented x3. Cranial nerves: There is good facial symmetry. The speech is fluent and clear. Soft palate rises symmetrically and there is no tongue deviation. Hearing is intact to conversational tone. Sensation: Sensation is intact to light touch throughout Motor: Strength is 5/5 in the bilateral upper and lower extremities.   Shoulder shrug is equal and symmetric.  There is no pronator drift.  Movement examination: Tone: There is normal tone in the UE/LE Abnormal movements: there is mild tremor of the outstretched hands.  Some tremor with archimedes spirals.  She spills water when pouring from one glass to another but some is due to carelessness Coordination:  There is no decremation  with RAM's, with any form of RAMS, including alternating supination and pronation of the forearm, hand opening and closing, finger taps, heel taps and toe taps. Gait and Station: The patient ambulates well in the hall   Lab Results  Component Value Date   TSH 0.82 07/04/2017    I was able to get a copy of her lab work from her primary care physician dated December 08, 2016.  White blood cells are 5.8, hemoglobin 12.4, hematocrit 37.6 and platelets 180.  Sodium was  139, potassium 4.7, chloride 107, CO2 28, BUN 23, creatinine 1.35.  Glucose was 95.  AST 16, ALT 13, alkaline phosphatase 56.   Assessment/Plan:   1.  Tremor  -better off of lithium and latuda.  Still on seroquel.  Has mild tremor but isn't bothering her.  Has nadolol for prn use and thinks it works well.  Will f/u prn  CC:  Lavone Orn, MD

## 2018-02-20 ENCOUNTER — Ambulatory Visit (INDEPENDENT_AMBULATORY_CARE_PROVIDER_SITE_OTHER): Payer: Medicare Other | Admitting: Neurology

## 2018-02-20 ENCOUNTER — Encounter: Payer: Self-pay | Admitting: Neurology

## 2018-02-20 VITALS — BP 90/60 | HR 94 | Ht 65.5 in | Wt 124.0 lb

## 2018-02-20 DIAGNOSIS — R251 Tremor, unspecified: Secondary | ICD-10-CM | POA: Diagnosis not present

## 2018-03-01 ENCOUNTER — Encounter (HOSPITAL_COMMUNITY): Payer: Self-pay | Admitting: Psychiatry

## 2018-03-01 ENCOUNTER — Ambulatory Visit (INDEPENDENT_AMBULATORY_CARE_PROVIDER_SITE_OTHER): Payer: Medicare Other | Admitting: Psychiatry

## 2018-03-01 VITALS — BP 118/70 | HR 72 | Ht 65.5 in | Wt 124.6 lb

## 2018-03-01 DIAGNOSIS — F411 Generalized anxiety disorder: Secondary | ICD-10-CM

## 2018-03-01 DIAGNOSIS — F319 Bipolar disorder, unspecified: Secondary | ICD-10-CM

## 2018-03-01 MED ORDER — QUETIAPINE FUMARATE 50 MG PO TABS: 50.0000 mg | ORAL_TABLET | Freq: Every day | ORAL | 0 refills | Status: DC

## 2018-03-01 MED ORDER — LAMOTRIGINE 100 MG PO TABS: 100.0000 mg | ORAL_TABLET | Freq: Every day | ORAL | 0 refills | Status: DC

## 2018-03-01 MED ORDER — VENLAFAXINE HCL 100 MG PO TABS: 100.0000 mg | ORAL_TABLET | Freq: Two times a day (BID) | ORAL | 0 refills | Status: DC

## 2018-03-01 NOTE — Progress Notes (Signed)
BH MD/PA/NP OP Progress Note  03/01/2018 9:12 AM Sandra Cox  MRN:  [id number]  Chief Complaint: I am very confused because I am not taking the medication as opposed to.  But I am doing very well and I have no depression since November.  HPI: Sandra Cox came for her follow-up appointment.  She appears very anxious nervous and confused because she endorsed that taking the medication more than she was prescribed.  She is no longer taking Effexor capsule and now she is taking venlafaxine tablet 100 mg twice a day.  It is unclear who prescribed venlafaxine tablet.  Patient told it was shipped from [employer].  However patient noticed since taking the 200 mg of venlafaxine she is doing much better and she has less anxiety and less depression.  She is able to do a lot of things.  Her energy level is improved.  And she is only taking Seroquel 50 mg for insomnia.  She feels that in the morning she wakes up early and she can do a lot of things.  She is taking Lamictal 100 mg daily and lorazepam 0.5 mg half tablet as needed when she is very anxious.  Recently she seen neurology for tremors but there were no changes.  Patient denies any crying spells, irritability, feeling of hopelessness or worthlessness.  She denies any suicidal thoughts.  He denies any delusion, mania or any aggressive behavior.  She is requesting not to change the medication as she is feeling better.  Her husband came on the Christmas and she had a good time.  She is separated from her husband but is still had a very good relationship.  Patient lives by herself.  She denies drinking or using any illegal substances.  Her appetite is okay.  Her energy level is improved.  Visit Diagnosis:    ICD-10-CM   1. GAD (generalized anxiety disorder) F41.1 lamoTRIgine (LAMICTAL) 100 MG tablet    venlafaxine (EFFEXOR) 100 MG tablet  2. Bipolar I disorder (HCC) F31.9 lamoTRIgine (LAMICTAL) 100 MG tablet    QUEtiapine (SEROQUEL) 50 MG tablet    Past  Psychiatric History: Reviewed. History of depression since 80s.  Saw psychiatrist in [place] and then moved to [place] and then moved to [place] in a retirement home.  Saw Lisa Pullis at [facility] and prescribed Seroquel and Latuda together, Zoloft that did not work and lithium that cause tremors.    We had discontinue lithium and Latuda which helped her tremors.  No history of psychiatric admission treatment or any suicidal attempt.  History of mania and rapid cycling 3-4 times a year.  Past Medical History:  Past Medical History:  Diagnosis Date  . Bipolar disorder [facility])     Past Surgical History:  Procedure Laterality Date  . CATARACT EXTRACTION Right   . FACELIFT, LOWER 2/3  2014    Family Psychiatric History: Reviewed.  Family History:  Family History  Problem Relation Age of Onset  . Stroke Mother   . Heart attack Father   . Healthy Daughter     Social History:  Social History   Socioeconomic History  . Marital status: Married    Spouse name: Not on file  . Number of children: 2  . Years of education: Not on file  . Highest education level: Bachelor's degree (e.g., BA, AB, BS)  Occupational History  . Occupation: retired    Comment: Pharmacist, hospital, 3rd grade x 7 years  Social Needs  . Financial resource strain: Not hard at  all  . Food insecurity:    Worry: Never true    Inability: Never true  . Transportation needs:    Medical: No    Non-medical: No  Tobacco Use  . Smoking status: Never Smoker  . Smokeless tobacco: Never Used  Substance and Sexual Activity  . Alcohol use: No  . Drug use: No  . Sexual activity: Not Currently  Lifestyle  . Physical activity:    Days per week: 0 days    Minutes per session: 0 min  . Stress: To some extent  Relationships  . Social connections:    Talks on phone: Not on file    Gets together: Not on file    Attends religious service: Not on file    Active member of club or organization: Not on file    Attends  meetings of clubs or organizations: Not on file    Relationship status: Not on file  Other Topics Concern  . Not on file  Social History Narrative  . Not on file    Allergies: No Known Allergies  Metabolic Disorder Labs: No results found for: HGBA1C, MPG No results found for: PROLACTIN No results found for: CHOL, TRIG, HDL, CHOLHDL, VLDL, LDLCALC Lab Results  Component Value Date   TSH 0.82 07/04/2017    Therapeutic Level Labs: No results found for: LITHIUM No results found for: VALPROATE No components found for:  CBMZ  Current Medications: Current Outpatient Medications  Medication Sig Dispense Refill  . clobetasol (OLUX) 0.05 % topical foam   3  . lamoTRIgine (LAMICTAL) 25 MG tablet Take 50 mg by mouth daily.    Marland Kitchen LORazepam (ATIVAN) 0.5 MG tablet Take 1 tablet (0.5 mg total) by mouth daily. 30 tablet 1  . nadolol (CORGARD) 20 MG tablet Take 20 mg by mouth 2 (two) times daily.    . pravastatin (PRAVACHOL) 40 MG tablet TK 1 T PO QD  3  . QUEtiapine (SEROQUEL) 100 MG tablet Take 1 tablet (100 mg total) by mouth at bedtime. (Patient taking differently: Take 50 mg by mouth at bedtime. ) 90 tablet 0  . venlafaxine XR (EFFEXOR-XR) 150 MG 24 hr capsule Take 1 capsule (150 mg total) by mouth daily with breakfast. 90 capsule 0   No current facility-administered medications for this visit.      Musculoskeletal: Strength & Muscle Tone: within normal limits Gait & Station: normal Patient leans: N/A  Psychiatric Specialty Exam: ROS  Blood pressure 118/70, pulse 72, height 5' 5.5" (1.664 m), weight 124 lb 9.6 oz (56.5 kg).Body mass index is 20.42 kg/m.  General Appearance: Fairly Groomed and Neat  Eye Contact:  Fair  Speech:  Clear and Coherent and Fast  Volume:  Normal  Mood:  Anxious and Emotional when talking about her medication dosage.  Affect:  Congruent  Thought Process:  Descriptions of Associations: Intact  Orientation:  Full (Time, Place, and Person)  Thought  Content: Rumination   Suicidal Thoughts:  No  Homicidal Thoughts:  No  Memory:  Immediate;   Fair Recent;   Good Remote;   Fair  Judgement:  Fair  Insight:  Present  Psychomotor Activity:  Increased and Tremor  Concentration:  Concentration: Fair and Attention Span: Fair  Recall:  [employer] of Knowledge: Fair  Language: Good  Akathisia:  No  Handed:  Right  AIMS (if indicated): not done  Assets:  Communication Skills Desire for Improvement Housing Resilience  ADL's:  Intact  Cognition: WNL  Sleep:  Good   Screenings: PHQ2-9     Office Visit from 07/02/2016 in Primary Care at [facility] Total Score  0       Assessment and Plan: Bipolar disorder type I.  Generalized anxiety disorder.  I reviewed her symptoms, records from neurology and the bottles she brought with her today.  It is unclear who prescribed venlafaxine tablet 100 mg and she is taking total dose of 200 mg daily.  She noticed improvement in her depression but it appears that she is also more anxious nervous.  She admitted confusion about the dosage.  She also cut down the Seroquel on her own to 50 mg only at bedtime.  Though she claimed that she is sleeping good with a half dose but I raised the concern cutting down her Seroquel and increasing her venlafaxine may cause exacerbation of mania.  But patient insists that increasing venlafaxine helping her mood.  She does not want to take capsule.  I will provide venlafaxine tablet 100 mg twice a day for 4 weeks to see if her mood remains stable.  I recommended that if she started to have manic terms then she should immediately call us.  I also reminded her anytime if she wants to see the discrepancy of the medication then she should call the doctor immediately.  We explained that cutting down the quetiapine without discussing with Dr. can cause worsening of the symptoms.  Patient apologized and agree with the plan.  I encouraged to continue [facility] for therapy.  For now she  will continue lorazepam 0.5 mg half tablet as needed and she does not need a new prescription.  Patient has no rash, itching, tremors or shakes.  I will see her again in 4 weeks. Time spent 30 minutes.  More than 50% of the time spent in psychoeducation, counseling and coordination of care.  Discuss safety plan that anytime having active suicidal thoughts or homicidal thoughts then patient need to call 911 or go to the local emergency room.     Kathlee Nations, MD 03/01/2018, 9:12 AM

## 2018-03-15 DIAGNOSIS — H25812 Combined forms of age-related cataract, left eye: Secondary | ICD-10-CM | POA: Diagnosis not present

## 2018-03-15 DIAGNOSIS — H2512 Age-related nuclear cataract, left eye: Secondary | ICD-10-CM | POA: Diagnosis not present

## 2018-03-19 DIAGNOSIS — R269 Unspecified abnormalities of gait and mobility: Secondary | ICD-10-CM | POA: Diagnosis not present

## 2018-03-19 DIAGNOSIS — R5383 Other fatigue: Secondary | ICD-10-CM | POA: Diagnosis not present

## 2018-03-19 DIAGNOSIS — E538 Deficiency of other specified B group vitamins: Secondary | ICD-10-CM | POA: Diagnosis not present

## 2018-03-19 DIAGNOSIS — R42 Dizziness and giddiness: Secondary | ICD-10-CM | POA: Diagnosis not present

## 2018-03-20 ENCOUNTER — Ambulatory Visit (INDEPENDENT_AMBULATORY_CARE_PROVIDER_SITE_OTHER): Payer: Medicare Other | Admitting: Licensed Clinical Social Worker

## 2018-03-20 ENCOUNTER — Encounter (HOSPITAL_COMMUNITY): Payer: Self-pay | Admitting: Licensed Clinical Social Worker

## 2018-03-20 DIAGNOSIS — F411 Generalized anxiety disorder: Secondary | ICD-10-CM

## 2018-03-20 DIAGNOSIS — F319 Bipolar disorder, unspecified: Secondary | ICD-10-CM | POA: Diagnosis not present

## 2018-03-20 NOTE — Progress Notes (Signed)
   THERAPIST PROGRESS NOTE  Session Time: 9:00am-10:00am  Participation Level: Active  Behavioral Response: Well GroomedAlertEuthymic  Type of Therapy: Individual Therapy  Treatment Goals addressed: "I don't want to feel as nervous and to decrease my depression".   Interventions: Motivational Interviewing, CBT, Grounding & Mindfulness Techniques, psychoeducation  Summary: Sandra Cox is a 73 y.o. female who presents with Bipolar I Disorder, depressed and Generalized Anxiety Disorder.   Suicidal/Homicidal: No - without intent/plan  Therapist Response: Sandra Cox met with Sandra Cox for an individual session.  Sandra Cox discussed her psychiatric symptoms, her current life events and her homework. Sandra Cox shared that she has been feeling tired lately and had a few days of vertigo. Following this, she still feels a little dizzy when she gets up, but she can stabilize within a few moments. Sandra Cox also reported that her blood pressure was high yesterday, but today it was normal. Sandra Cox had nurse take blood pressure and it was in the normal range.  Sandra Cox discussed mood and noted overall positive interactions and good mood over the past several weeks. She reports low anxiety, but some worrying and "overthinking". Sandra Cox processed these concerns and identified coping skills such as listening to music, watching comedy specials, and reading. Sandra Cox identified increased social interactions and better relationship with her daughter.   Plan: Return again in 4 weeks  Diagnosis:     Axis I: Bipolar I Disorder, depressed and Generalized Anxiety Disorder.     Jobe Marker Duncan, LCSW 03/20/2018

## 2018-03-28 DIAGNOSIS — L570 Actinic keratosis: Secondary | ICD-10-CM | POA: Diagnosis not present

## 2018-04-04 ENCOUNTER — Encounter (HOSPITAL_COMMUNITY): Payer: Self-pay | Admitting: Psychiatry

## 2018-04-04 ENCOUNTER — Ambulatory Visit (INDEPENDENT_AMBULATORY_CARE_PROVIDER_SITE_OTHER): Payer: Medicare Other | Admitting: Psychiatry

## 2018-04-04 DIAGNOSIS — F411 Generalized anxiety disorder: Secondary | ICD-10-CM

## 2018-04-04 DIAGNOSIS — F319 Bipolar disorder, unspecified: Secondary | ICD-10-CM | POA: Diagnosis not present

## 2018-04-04 MED ORDER — LAMOTRIGINE 100 MG PO TABS: 100.0000 mg | ORAL_TABLET | Freq: Every day | ORAL | 0 refills | Status: DC

## 2018-04-04 MED ORDER — LORAZEPAM 0.5 MG PO TABS: 0.5000 mg | ORAL_TABLET | Freq: Every day | ORAL | 0 refills | Status: DC

## 2018-04-04 MED ORDER — QUETIAPINE FUMARATE 50 MG PO TABS: 50.0000 mg | ORAL_TABLET | Freq: Every day | ORAL | 0 refills | Status: DC

## 2018-04-04 MED ORDER — VENLAFAXINE HCL 100 MG PO TABS: 100.0000 mg | ORAL_TABLET | Freq: Two times a day (BID) | ORAL | 0 refills | Status: DC

## 2018-04-04 NOTE — Progress Notes (Signed)
BH MD/PA/NP OP Progress Note  04/04/2018 1:55 PM Sandra Cox  MRN:  [id number]  Chief Complaint: I am feeling much better.  Looks like my confusion is resolved and I mood is lifted.  HPI: Sandra Cox came for her appointment.  She is more calm and less confused today.  She feels the current medicine working.  She is not anxious or confused.  Now she is taking the medication as prescribed.  She still have some time anxiety but she feel her spirits are high and her mood is lifted.  She denies any mania, psychosis.  She is sleeping better with Seroquel 50 mg at bedtime.  Her energy level is good.  She is happy that her husband is currently visiting her.  She has no rash, itching or tremors.  She denies any crying spells, irritability or any feeling of hopelessness.  She lives by herself and she has her own place.  She denies drinking or using any illegal substances.  She is separated from her husband but she had a good relationship with him.  Visit Diagnosis:    ICD-10-CM   1. Bipolar I disorder (HCC) F31.9 QUEtiapine (SEROQUEL) 50 MG tablet    lamoTRIgine (LAMICTAL) 100 MG tablet  2. GAD (generalized anxiety disorder) F41.1 lamoTRIgine (LAMICTAL) 100 MG tablet    LORazepam (ATIVAN) 0.5 MG tablet    venlafaxine (EFFEXOR) 100 MG tablet    DISCONTINUED: venlafaxine (EFFEXOR) 100 MG tablet    Past Psychiatric History: Reviewed H/O depression and mania. Saw psychiatrist in [place], [place] and [place]. Saw Lattie Haw Pullis at [facility] and given Seroquel, Anette Guarneri, Zoloft that did not work and lithium that cause tremors. D/C lithium and Latuda to helped tremors.  No h/o inpatient or suicidal attempt.   Past Medical History:  Past Medical History:  Diagnosis Date  . Bipolar disorder [facility])     Past Surgical History:  Procedure Laterality Date  . CATARACT EXTRACTION Right   . FACELIFT, LOWER 2/3  2014    Family Psychiatric History: Reviewed  Family History:  Family History  Problem  Relation Age of Onset  . Stroke Mother   . Heart attack Father   . Healthy Daughter     Social History:  Social History   Socioeconomic History  . Marital status: Married    Spouse name: Not on file  . Number of children: 2  . Years of education: Not on file  . Highest education level: Bachelor's degree (e.g., BA, AB, BS)  Occupational History  . Occupation: retired    Comment: Pharmacist, hospital, 3rd grade x 7 years  Social Needs  . Financial resource strain: Not hard at all  . Food insecurity:    Worry: Never true    Inability: Never true  . Transportation needs:    Medical: No    Non-medical: No  Tobacco Use  . Smoking status: Never Smoker  . Smokeless tobacco: Never Used  Substance and Sexual Activity  . Alcohol use: No  . Drug use: No  . Sexual activity: Not Currently  Lifestyle  . Physical activity:    Days per week: 0 days    Minutes per session: 0 min  . Stress: To some extent  Relationships  . Social connections:    Talks on phone: Not on file    Gets together: Not on file    Attends religious service: Not on file    Active member of club or organization: Not on file    Attends meetings of clubs  or organizations: Not on file    Relationship status: Not on file  Other Topics Concern  . Not on file  Social History Narrative  . Not on file    Allergies: No Known Allergies  Metabolic Disorder Labs: No results found for: HGBA1C, MPG No results found for: PROLACTIN No results found for: CHOL, TRIG, HDL, CHOLHDL, VLDL, LDLCALC Lab Results  Component Value Date   TSH 0.82 07/04/2017    Therapeutic Level Labs: No results found for: LITHIUM No results found for: VALPROATE No components found for:  CBMZ  Current Medications: Current Outpatient Medications  Medication Sig Dispense Refill  . lamoTRIgine (LAMICTAL) 100 MG tablet Take 1 tablet (100 mg total) by mouth daily. 90 tablet 0  . LORazepam (ATIVAN) 0.5 MG tablet Take 1 tablet (0.5 mg total) by mouth  daily. 30 tablet 1  . pravastatin (PRAVACHOL) 40 MG tablet TK 1 T PO QD  3  . QUEtiapine (SEROQUEL) 50 MG tablet Take 1 tablet (50 mg total) by mouth at bedtime. 90 tablet 0  . venlafaxine (EFFEXOR) 100 MG tablet Take 1 tablet (100 mg total) by mouth 2 (two) times daily with a meal. 60 tablet 0  . clobetasol (OLUX) 0.05 % topical foam   3  . nadolol (CORGARD) 20 MG tablet Take 20 mg by mouth 2 (two) times daily.     No current facility-administered medications for this visit.      Musculoskeletal: Strength & Muscle Tone: within normal limits Gait & Station: normal Patient leans: N/A  Psychiatric Specialty Exam: ROS  Blood pressure 99/65, pulse 84, height 5' 5.5" (1.664 m), weight 122 lb (55.3 kg), SpO2 99 %.There is no height or weight on file to calculate BMI.  General Appearance: Neat  Eye Contact:  Good  Speech:  Clear and Coherent and fast  Volume:  Normal  Mood:  Anxious  Affect:  Congruent  Thought Process:  Descriptions of Associations: Intact  Orientation:  Full (Time, Place, and Person)  Thought Content: Rumination   Suicidal Thoughts:  No  Homicidal Thoughts:  No  Memory:  Immediate;   Fair Recent;   Fair Remote;   Fair  Judgement:  Good  Insight:  Present  Psychomotor Activity:  Increased  Concentration:  Concentration: Fair and Attention Span: Fair  Recall:  [employer] of Knowledge: Good  Language: Good  Akathisia:  No  Handed:  Right  AIMS (if indicated): not done  Assets:  Communication Skills Desire for Improvement Housing Resilience Social Support  ADL's:  Intact  Cognition: WNL  Sleep:  Good   Screenings: PHQ2-9     Office Visit from 07/02/2016 in Primary Care at [facility] Total Score  0       Assessment and Plan: Bipolar disorder type I.  Generalized anxiety disorder.  Patient doing better since medicine adjusted on the last visit.  She is taking venlafaxine tablet 100 mg twice a day, Seroquel 50 mg at bedtime, Lamictal 1 mg daily and  lorazepam 0.5 mg half to 1 tablet as needed at bedtime.  Discussed medication side effects and benefits.  Recommended to call us back if she has any question or any concern.  Follow-up in 3 months.   Sandra Nations, MD 04/04/2018, 1:55 PM

## 2018-04-12 ENCOUNTER — Ambulatory Visit (HOSPITAL_COMMUNITY): Payer: Medicare Other | Admitting: Licensed Clinical Social Worker

## 2018-04-19 ENCOUNTER — Ambulatory Visit (HOSPITAL_COMMUNITY): Payer: Medicare Other | Admitting: Licensed Clinical Social Worker

## 2018-06-15 ENCOUNTER — Other Ambulatory Visit (HOSPITAL_COMMUNITY): Payer: Self-pay | Admitting: Psychiatry

## 2018-06-15 DIAGNOSIS — F411 Generalized anxiety disorder: Secondary | ICD-10-CM

## 2018-06-15 DIAGNOSIS — F319 Bipolar disorder, unspecified: Secondary | ICD-10-CM

## 2018-07-04 ENCOUNTER — Encounter (HOSPITAL_COMMUNITY): Payer: Self-pay | Admitting: Psychiatry

## 2018-07-04 ENCOUNTER — Other Ambulatory Visit: Payer: Self-pay

## 2018-07-04 ENCOUNTER — Ambulatory Visit (INDEPENDENT_AMBULATORY_CARE_PROVIDER_SITE_OTHER): Payer: Medicare Other | Admitting: Psychiatry

## 2018-07-04 DIAGNOSIS — F411 Generalized anxiety disorder: Secondary | ICD-10-CM

## 2018-07-04 DIAGNOSIS — F319 Bipolar disorder, unspecified: Secondary | ICD-10-CM | POA: Diagnosis not present

## 2018-07-04 MED ORDER — LORAZEPAM 0.5 MG PO TABS: 0.5000 mg | ORAL_TABLET | Freq: Every day | ORAL | 0 refills | Status: DC

## 2018-07-04 MED ORDER — LAMOTRIGINE 100 MG PO TABS: 100.0000 mg | ORAL_TABLET | Freq: Every day | ORAL | 0 refills | Status: DC

## 2018-07-04 MED ORDER — QUETIAPINE FUMARATE 50 MG PO TABS: 50.0000 mg | ORAL_TABLET | Freq: Every day | ORAL | 0 refills | Status: DC

## 2018-07-04 MED ORDER — VENLAFAXINE HCL 100 MG PO TABS: 100.0000 mg | ORAL_TABLET | Freq: Two times a day (BID) | ORAL | 0 refills | Status: DC

## 2018-07-04 NOTE — Progress Notes (Signed)
Virtual Visit via Telephone Note  I connected with Sandra Cox on 07/04/18 at 11:40 AM EDT by telephone and verified that I am speaking with the correct person using two identifiers.   I discussed the limitations, risks, security and privacy concerns of performing an evaluation and management service by telephone and the availability of in person appointments. I also discussed with the patient that there may be a patient responsible charge related to this service. The patient expressed understanding and agreed to proceed.   History of Present Illness: Patient evaluated through phone session.  She is doing much better and taking the medication as prescribed.  She is less confused and she actually enjoys hanging around with neighbors.  She started playing chess and she noticed her memory is slowly and gradually much clear.  She has no more confusion.  She is sleeping good.  She is taking Seroquel which is helping her sleep.  Her energy level is good.  She denies any crying spells or any feeling of hopelessness or worthlessness.  [place] and all her meals are delivered.  She started walking outside and make some friends and now she spent time with them playing chess.  She denies any paranoia, hallucination or any feeling of hopelessness.  She has no tremors, shakes or any EPS.  Her appetite is okay.  Her energy level is good.  She like to continue her current medication which is working very well.  She is separated from her husband but is still had a good relationship with him who travels and visit him occasionally.   Past Psychiatric History: Reviewed H/O depression and mania. Saw psychiatrist in [place], [place] and [place]. Saw Jari Favre [facility] and given Seroquel, Latuda, Zoloft that did not work and lithium that cause tremors.D/C lithium and Latuda to helped tremors. No h/o inpatient or suicidal attempt.      Psychiatric Specialty Exam: Physical  Exam  ROS  There were no vitals taken for this visit.There is no height or weight on file to calculate BMI.  General Appearance: NA  Eye Contact:  NA  Speech:  Clear and Coherent and fast  Volume:  Normal  Mood:  Anxious  Affect:  NA  Thought Process:  Goal Directed  Orientation:  Full (Time, Place, and Person)  Thought Content:  Logical  Suicidal Thoughts:  No  Homicidal Thoughts:  No  Memory:  Immediate;   Good Recent;   Good Remote;   Good  Judgement:  Good  Insight:  Good  Psychomotor Activity:  NA  Concentration:  Concentration: Good and Attention Span: Good  Recall:  Good  Fund of Knowledge:  Good  Language:  Good  Akathisia:  NA  Handed:  Right  AIMS (if indicated):     Assets:  Communication Skills Desire for Improvement Housing Resilience Social Support  ADL's:  Intact  Cognition:  WNL  Sleep:   7 hrs      Assessment and Plan: Bipolar disorder type I.  Generalized anxiety disorder.  Patient is a stable and doing well on her current medication.  Continue venlafaxine 100 mg twice a day tablet, continue Seroquel 50 mg at bedtime, Lamictal 100 mg daily and lorazepam 0.5 mg as needed daily.  Discussed medication side effects and benefits.  She has no rash, itching or tremors.  She is not interested in therapy.  Recommended to call us back if she has any question or any concern.  Follow-up in 3 months.  Follow Up Instructions:    I discussed the assessment and treatment plan with the patient. The patient was provided an opportunity to ask questions and all were answered. The patient agreed with the plan and demonstrated an understanding of the instructions.   The patient was advised to call back or seek an in-person evaluation if the symptoms worsen or if the condition fails to improve as anticipated.  I provided 20 minutes of non-face-to-face time during this encounter.   Kathlee Nations, MD

## 2018-07-05 ENCOUNTER — Ambulatory Visit (HOSPITAL_COMMUNITY): Payer: Medicare Other | Admitting: Psychiatry

## 2018-07-05 DIAGNOSIS — Z1389 Encounter for screening for other disorder: Secondary | ICD-10-CM | POA: Diagnosis not present

## 2018-07-05 DIAGNOSIS — N183 Chronic kidney disease, stage 3 (moderate): Secondary | ICD-10-CM | POA: Diagnosis not present

## 2018-07-05 DIAGNOSIS — Z0000 Encounter for general adult medical examination without abnormal findings: Secondary | ICD-10-CM | POA: Diagnosis not present

## 2018-07-05 DIAGNOSIS — F319 Bipolar disorder, unspecified: Secondary | ICD-10-CM | POA: Diagnosis not present

## 2018-07-05 DIAGNOSIS — E7800 Pure hypercholesterolemia, unspecified: Secondary | ICD-10-CM | POA: Diagnosis not present

## 2018-07-05 DIAGNOSIS — M810 Age-related osteoporosis without current pathological fracture: Secondary | ICD-10-CM | POA: Diagnosis not present

## 2018-07-05 DIAGNOSIS — H903 Sensorineural hearing loss, bilateral: Secondary | ICD-10-CM | POA: Diagnosis not present

## 2018-07-06 ENCOUNTER — Ambulatory Visit (HOSPITAL_COMMUNITY): Payer: Medicare Other | Admitting: Psychiatry

## 2018-07-09 DIAGNOSIS — E7800 Pure hypercholesterolemia, unspecified: Secondary | ICD-10-CM | POA: Diagnosis not present

## 2018-07-09 DIAGNOSIS — N183 Chronic kidney disease, stage 3 (moderate): Secondary | ICD-10-CM | POA: Diagnosis not present

## 2018-07-18 DIAGNOSIS — H93A1 Pulsatile tinnitus, right ear: Secondary | ICD-10-CM | POA: Diagnosis not present

## 2018-07-18 DIAGNOSIS — H903 Sensorineural hearing loss, bilateral: Secondary | ICD-10-CM | POA: Diagnosis not present

## 2018-08-21 DIAGNOSIS — H26493 Other secondary cataract, bilateral: Secondary | ICD-10-CM | POA: Diagnosis not present

## 2018-08-21 DIAGNOSIS — H43811 Vitreous degeneration, right eye: Secondary | ICD-10-CM | POA: Diagnosis not present

## 2018-09-06 DIAGNOSIS — H26491 Other secondary cataract, right eye: Secondary | ICD-10-CM | POA: Diagnosis not present

## 2018-09-23 ENCOUNTER — Other Ambulatory Visit (HOSPITAL_COMMUNITY): Payer: Self-pay | Admitting: Psychiatry

## 2018-09-23 DIAGNOSIS — F319 Bipolar disorder, unspecified: Secondary | ICD-10-CM

## 2018-10-02 ENCOUNTER — Encounter (HOSPITAL_COMMUNITY): Payer: Self-pay | Admitting: Psychiatry

## 2018-10-02 ENCOUNTER — Ambulatory Visit (INDEPENDENT_AMBULATORY_CARE_PROVIDER_SITE_OTHER): Payer: Medicare Other | Admitting: Psychiatry

## 2018-10-02 ENCOUNTER — Other Ambulatory Visit: Payer: Self-pay

## 2018-10-02 DIAGNOSIS — F411 Generalized anxiety disorder: Secondary | ICD-10-CM | POA: Diagnosis not present

## 2018-10-02 DIAGNOSIS — F319 Bipolar disorder, unspecified: Secondary | ICD-10-CM | POA: Diagnosis not present

## 2018-10-02 MED ORDER — VENLAFAXINE HCL 100 MG PO TABS: 100.0000 mg | ORAL_TABLET | Freq: Two times a day (BID) | ORAL | 0 refills | Status: DC

## 2018-10-02 MED ORDER — LORAZEPAM 0.5 MG PO TABS: ORAL_TABLET | ORAL | 0 refills | Status: DC

## 2018-10-02 MED ORDER — LAMOTRIGINE 100 MG PO TABS: 100.0000 mg | ORAL_TABLET | Freq: Every day | ORAL | 0 refills | Status: DC

## 2018-10-02 MED ORDER — QUETIAPINE FUMARATE 50 MG PO TABS: 50.0000 mg | ORAL_TABLET | Freq: Every day | ORAL | 0 refills | Status: DC

## 2018-10-02 NOTE — Progress Notes (Signed)
Virtual Visit via Telephone Note  I connected with Sandra Cox on 10/02/18 at 11:00 AM EDT by telephone and verified that I am speaking with the correct person using two identifiers.   I discussed the limitations, risks, security and privacy concerns of performing an evaluation and management service by telephone and the availability of in person appointments. I also discussed with the patient that there may be a patient responsible charge related to this service. The patient expressed understanding and agreed to proceed.   History of Present Illness: Patient was evaluated through phone session.  She is doing much better on her current medication.  She has cut down her lorazepam and only taking half tablet at bedtime.  She feels the current combination of medicines is doing the job.  She is not anxious as much and she denies any crying spells or any feeling of hopelessness or worthlessness.  She denies any mania, psychosis, mood swing or any highs and lows.  She is sleeping good.  She [place] community and taking precaution going outside due to [place].  She walks every day 20 minutes outside and keep in touch with her friends and play chess with them.  She has no tremors, shakes or any EPS.  She sleeps good.  Her energy level is good.  She has no concern from the medication.  She denies drinking or using any illegal substances.  Her appetite is okay.  Her weight is stable.  Her husband continues to visit her once a month.    Past Psychiatric History:Reviewed H/Odepressionand mania.Saw psychiatrist in [place] and [place].Saw Jari Favre [facility] andgivenSeroquel,Latuda,Zoloft that did not work and lithium that cause tremors.D/Clithium and Latudatohelped tremors. No h/o inpatientorsuicidal attempt.     Psychiatric Specialty Exam: Physical Exam  ROS  There were no vitals taken for this visit.There is no height or weight on file to calculate BMI.   General Appearance: NA  Eye Contact:  NA  Speech:  Clear and Coherent and fast  Volume:  Normal  Mood:  Anxious  Affect:  NA  Thought Process:  Goal Directed  Orientation:  Full (Time, Place, and Person)  Thought Content:  WDL and Logical  Suicidal Thoughts:  No  Homicidal Thoughts:  No  Memory:  Immediate;   Good Recent;   Good Remote;   Good  Judgement:  Good  Insight:  Good  Psychomotor Activity:  NA  Concentration:  Concentration: Fair and Attention Span: Fair  Recall:  Good  Fund of Knowledge:  Good  Language:  Good  Akathisia:  No  Handed:  Right  AIMS (if indicated):     Assets:  Communication Skills Desire for Improvement Housing Resilience  ADL's:  Intact  Cognition:  WNL  Sleep:   ok      Assessment and Plan: Bipolar disorder type I.  Generalized anxiety disorder.  Patient is a stable on her current medication.  Continue venlafaxine 100 mg twice a day, Seroquel 50 mg at bedtime, Lamictal 100 mg daily and lorazepam 0.5 mg half tablet as needed for anxiety.  Discussed medication side effects and benefits.  Recommended to call us back if she is any question or any concern.  Patient is not interested in therapy.  Follow-up in 3 months.  Follow Up Instructions:    I discussed the assessment and treatment plan with the patient. The patient was provided an opportunity to ask questions and all were answered. The patient agreed with the plan and demonstrated an  understanding of the instructions.   The patient was advised to call back or seek an in-person evaluation if the symptoms worsen or if the condition fails to improve as anticipated.  I provided 20 minutes of non-face-to-face time during this encounter.   Kathlee Nations, MD

## 2018-10-03 ENCOUNTER — Ambulatory Visit (HOSPITAL_COMMUNITY): Payer: Medicare Other | Admitting: Psychiatry

## 2018-11-06 DIAGNOSIS — N183 Chronic kidney disease, stage 3 (moderate): Secondary | ICD-10-CM | POA: Diagnosis not present

## 2018-11-06 DIAGNOSIS — M1731 Unilateral post-traumatic osteoarthritis, right knee: Secondary | ICD-10-CM | POA: Diagnosis not present

## 2018-11-06 DIAGNOSIS — M810 Age-related osteoporosis without current pathological fracture: Secondary | ICD-10-CM | POA: Diagnosis not present

## 2018-11-12 DIAGNOSIS — M810 Age-related osteoporosis without current pathological fracture: Secondary | ICD-10-CM | POA: Diagnosis not present

## 2018-11-12 DIAGNOSIS — E7800 Pure hypercholesterolemia, unspecified: Secondary | ICD-10-CM | POA: Diagnosis not present

## 2018-11-12 DIAGNOSIS — N183 Chronic kidney disease, stage 3 unspecified: Secondary | ICD-10-CM | POA: Diagnosis not present

## 2018-11-12 DIAGNOSIS — M1731 Unilateral post-traumatic osteoarthritis, right knee: Secondary | ICD-10-CM | POA: Diagnosis not present

## 2018-11-23 DIAGNOSIS — D225 Melanocytic nevi of trunk: Secondary | ICD-10-CM | POA: Diagnosis not present

## 2018-11-23 DIAGNOSIS — L821 Other seborrheic keratosis: Secondary | ICD-10-CM | POA: Diagnosis not present

## 2018-11-23 DIAGNOSIS — Z85828 Personal history of other malignant neoplasm of skin: Secondary | ICD-10-CM | POA: Diagnosis not present

## 2018-11-23 DIAGNOSIS — D1801 Hemangioma of skin and subcutaneous tissue: Secondary | ICD-10-CM | POA: Diagnosis not present

## 2018-11-23 DIAGNOSIS — L570 Actinic keratosis: Secondary | ICD-10-CM | POA: Diagnosis not present

## 2018-11-23 DIAGNOSIS — L400 Psoriasis vulgaris: Secondary | ICD-10-CM | POA: Diagnosis not present

## 2018-12-31 ENCOUNTER — Ambulatory Visit (INDEPENDENT_AMBULATORY_CARE_PROVIDER_SITE_OTHER): Payer: Medicare Other | Admitting: Psychiatry

## 2018-12-31 ENCOUNTER — Encounter (HOSPITAL_COMMUNITY): Payer: Self-pay | Admitting: Psychiatry

## 2018-12-31 ENCOUNTER — Other Ambulatory Visit: Payer: Self-pay

## 2018-12-31 DIAGNOSIS — F411 Generalized anxiety disorder: Secondary | ICD-10-CM | POA: Diagnosis not present

## 2018-12-31 DIAGNOSIS — F319 Bipolar disorder, unspecified: Secondary | ICD-10-CM | POA: Diagnosis not present

## 2018-12-31 MED ORDER — LORAZEPAM 0.5 MG PO TABS: ORAL_TABLET | ORAL | 0 refills | Status: DC

## 2018-12-31 MED ORDER — VENLAFAXINE HCL 100 MG PO TABS: 100.0000 mg | ORAL_TABLET | Freq: Two times a day (BID) | ORAL | 0 refills | Status: DC

## 2018-12-31 MED ORDER — QUETIAPINE FUMARATE 50 MG PO TABS: 50.0000 mg | ORAL_TABLET | Freq: Every day | ORAL | 0 refills | Status: DC

## 2018-12-31 MED ORDER — LAMOTRIGINE 100 MG PO TABS: 100.0000 mg | ORAL_TABLET | Freq: Every day | ORAL | 0 refills | Status: DC

## 2018-12-31 NOTE — Progress Notes (Signed)
Virtual Visit via Telephone Note  I connected with Sandra Cox on 12/31/18 at 11:40 AM EST by telephone and verified that I am speaking with the correct person using two identifiers.   I discussed the limitations, risks, security and privacy concerns of performing an evaluation and management service by telephone and the availability of in person appointments. I also discussed with the patient that there may be a patient responsible charge related to this service. The patient expressed understanding and agreed to proceed.   History of Present Illness: Patient was evaluated by phone session.  She is doing much better on her current medication.  She is sleeping better.  She made some friends and she really enjoys their company.  She started playing chess with senior people and she had a good time with them.  She is sleeping good.  She denies any irritability, anger, mania no psychosis.  Her energy level is good.  Her appetite is okay.  Her weight is a stable.  She has no tremors, shakes or any EPS.  She does not want to change the medication since it is working very well.   Past Psychiatric History:Reviewed H/Odepressionand mania.Saw psychiatrist in [place] and [place].Saw [facility] F[facility] andgivenSeroquel,Latuda,Zoloft that did not work and lithium that cause tremors.D/Clithium and Latudatohelped tremors. No h/o inpatientorsuicidal attempt.    Psychiatric Specialty Exam: Physical Exam  ROS  There were no vitals taken for this visit.There is no height or weight on file to calculate BMI.  General Appearance: NA  Eye Contact:  NA  Speech:  Clear and Coherent  Volume:  Normal  Mood:  Euthymic  Affect:  NA  Thought Process:  Goal Directed  Orientation:  Full (Time, Place, and Person)  Thought Content:  WDL and Logical  Suicidal Thoughts:  No  Homicidal Thoughts:  No  Memory:  Immediate;   Good Recent;   Good Remote;   Good  Judgement:   Good  Insight:  Good  Psychomotor Activity:  NA  Concentration:  Concentration: Good and Attention Span: Good  Recall:  Good  Fund of Knowledge:  Good  Language:  Good  Akathisia:  No  Handed:  Right  AIMS (if indicated):     Assets:  Communication Skills Desire for Improvement Housing Resilience Social Support Talents/Skills  ADL's:  Intact  Cognition:  WNL  Sleep:   good       Assessment and Plan: Bipolar disorder type I.  Generalized anxiety disorder.  Patient doing very well on her current medication.  I will continue venlafaxine 100 mg twice a day, Seroquel 50 mg at bedtime, Lamictal 100 mg daily and lorazepam 0.5 mg half tablet as needed for anxiety.  She has no rash, itching, tremors or shakes.  Recommended to call us back if she has any question of any concern.  Follow-up in 3 months.  Follow Up Instructions:    I discussed the assessment and treatment plan with the patient. The patient was provided an opportunity to ask questions and all were answered. The patient agreed with the plan and demonstrated an understanding of the instructions.   The patient was advised to call back or seek an in-person evaluation if the symptoms worsen or if the condition fails to improve as anticipated.  I provided 20 minutes of non-face-to-face time during this encounter.   Kathlee Nations, MD

## 2019-01-07 DIAGNOSIS — R432 Parageusia: Secondary | ICD-10-CM | POA: Diagnosis not present

## 2019-02-25 DIAGNOSIS — H26492 Other secondary cataract, left eye: Secondary | ICD-10-CM | POA: Diagnosis not present

## 2019-03-07 DIAGNOSIS — H26492 Other secondary cataract, left eye: Secondary | ICD-10-CM | POA: Diagnosis not present

## 2019-03-28 ENCOUNTER — Other Ambulatory Visit: Payer: Self-pay

## 2019-03-28 ENCOUNTER — Ambulatory Visit (INDEPENDENT_AMBULATORY_CARE_PROVIDER_SITE_OTHER): Payer: Medicare Other | Admitting: Psychiatry

## 2019-03-28 ENCOUNTER — Encounter (HOSPITAL_COMMUNITY): Payer: Self-pay | Admitting: Psychiatry

## 2019-03-28 DIAGNOSIS — F319 Bipolar disorder, unspecified: Secondary | ICD-10-CM | POA: Diagnosis not present

## 2019-03-28 DIAGNOSIS — F411 Generalized anxiety disorder: Secondary | ICD-10-CM | POA: Diagnosis not present

## 2019-03-28 MED ORDER — VENLAFAXINE HCL 100 MG PO TABS: 100.0000 mg | ORAL_TABLET | Freq: Two times a day (BID) | ORAL | 0 refills | Status: DC

## 2019-03-28 MED ORDER — LAMOTRIGINE 100 MG PO TABS: 100.0000 mg | ORAL_TABLET | Freq: Every day | ORAL | 0 refills | Status: DC

## 2019-03-28 MED ORDER — LORAZEPAM 0.5 MG PO TABS: ORAL_TABLET | ORAL | 0 refills | Status: DC

## 2019-03-28 MED ORDER — QUETIAPINE FUMARATE 50 MG PO TABS: 50.0000 mg | ORAL_TABLET | Freq: Every day | ORAL | 0 refills | Status: DC

## 2019-03-28 NOTE — Progress Notes (Signed)
Virtual Visit via Telephone Note  I connected with Sandra Cox on 03/28/19 at 10:40 AM EST by telephone and verified that I am speaking with the correct person using two identifiers.   I discussed the limitations, risks, security and privacy concerns of performing an evaluation and management service by telephone and the availability of in person appointments. I also discussed with the patient that there may be a patient responsible charge related to this service. The patient expressed understanding and agreed to proceed.   History of Present Illness: Patient was evaluated by phone session.  She is doing well on her medication.  She denies any mania, psychosis or any hallucination.  She sleeps 7 to 8 hours.  She really liked half lorazepam 0.5 mg which he takes at bedtime which keep her calm and good night sleep.  She denies any irritability, frustration or highs and lows.  She had a good support system.  She invited more senior people to play chess.  She is trying to recruit more female to play chess.  Her energy level is good.  Her appetite is okay.  She denies any panic attack, nervousness or severe anxiety.  She has no rash, itching tremors or shakes.  She admitted gained few pounds since the last session but admitted that she need to lose weight.  She has not done any blood work in a while.  I recommended she should have physical and blood work when [employer] condition gets better.   Past Psychiatric History:Reviewed H/Odepressionand mania.Saw psychiatrist in [place] and [place].Saw [facility] Fav[facility] andgivenSeroquel,Latuda,Zoloft that did not work and lithium that cause tremors.D/Clithium and Latudatohelped tremors. No h/o inpatientorsuicidal attempt.    Psychiatric Specialty Exam: Physical Exam  Review of Systems  There were no vitals taken for this visit.There is no height or weight on file to calculate BMI.  General Appearance: NA  Eye  Contact:  NA  Speech:  Clear and Coherent  Volume:  Normal  Mood:  Euthymic  Affect:  NA  Thought Process:  Goal Directed  Orientation:  Full (Time, Place, and Person)  Thought Content:  WDL  Suicidal Thoughts:  No  Homicidal Thoughts:  No  Memory:  Immediate;   Good Recent;   Good Remote;   Good  Judgement:  Good  Insight:  Present  Psychomotor Activity:  NA  Concentration:  Concentration: Fair and Attention Span: Fair  Recall:  Good  Fund of Knowledge:  Good  Language:  Good  Akathisia:  No  Handed:  Right  AIMS (if indicated):     Assets:  Communication Skills Desire for Improvement Housing Resilience Social Support  ADL's:  Intact  Cognition:  WNL  Sleep:   8 hrs      Assessment and Plan: Bipolar disorder type I.  Generalized anxiety disorder.  Patient is a stable on her current medication.  She does not want to change the dose.  I encouraged she should schedule for blood work with her PCP if they allow in person visit.  She also need annual physical.  Continue lamotrigine 100 mg daily, lorazepam 0.25 mg at bedtime, venlafaxine 100 mg twice a day and Seroquel 50 mg at bedtime.  Discussed medication side effects and benefits.  She has no rash, itching tremors or shakes.  Recommended to call us back if she is any question of any concern.  Follow-up in 3 months.  Follow Up Instructions:    I discussed the assessment and treatment plan with the patient. The  patient was provided an opportunity to ask questions and all were answered. The patient agreed with the plan and demonstrated an understanding of the instructions.   The patient was advised to call back or seek an in-person evaluation if the symptoms worsen or if the condition fails to improve as anticipated.  I provided 20 minutes of non-face-to-face time during this encounter.   Sandra Cox Sandra Lucatero, MD   

## 2019-04-05 ENCOUNTER — Telehealth (HOSPITAL_COMMUNITY): Payer: Self-pay | Admitting: *Deleted

## 2019-04-05 NOTE — Telephone Encounter (Signed)
Writer spoke with pharmacist Debbie with [facility] regarding Rx for Ativan. 0.5mg  sent in 03/28/19. Script reads no early refills but it is too early so they cannot hold Rx as it is a control. So pt or pharmacy will contact when pt due refill.

## 2019-04-05 NOTE — Telephone Encounter (Signed)
Ok. Please inform the patient.

## 2019-05-31 DIAGNOSIS — Z5181 Encounter for therapeutic drug level monitoring: Secondary | ICD-10-CM | POA: Diagnosis not present

## 2019-06-13 DIAGNOSIS — M25552 Pain in left hip: Secondary | ICD-10-CM | POA: Diagnosis not present

## 2019-06-13 DIAGNOSIS — M76892 Other specified enthesopathies of left lower limb, excluding foot: Secondary | ICD-10-CM | POA: Diagnosis not present

## 2019-06-24 ENCOUNTER — Encounter (HOSPITAL_COMMUNITY): Payer: Self-pay | Admitting: Psychiatry

## 2019-06-24 ENCOUNTER — Other Ambulatory Visit: Payer: Self-pay

## 2019-06-24 ENCOUNTER — Telehealth (INDEPENDENT_AMBULATORY_CARE_PROVIDER_SITE_OTHER): Payer: Medicare Other | Admitting: Psychiatry

## 2019-06-24 DIAGNOSIS — F411 Generalized anxiety disorder: Secondary | ICD-10-CM | POA: Diagnosis not present

## 2019-06-24 DIAGNOSIS — F319 Bipolar disorder, unspecified: Secondary | ICD-10-CM

## 2019-06-24 MED ORDER — VENLAFAXINE HCL 100 MG PO TABS: 100.0000 mg | ORAL_TABLET | Freq: Two times a day (BID) | ORAL | 0 refills | Status: DC

## 2019-06-24 MED ORDER — LAMOTRIGINE 100 MG PO TABS: 100.0000 mg | ORAL_TABLET | Freq: Every day | ORAL | 0 refills | Status: DC

## 2019-06-24 MED ORDER — LORAZEPAM 0.5 MG PO TABS: ORAL_TABLET | ORAL | 0 refills | Status: DC

## 2019-06-24 MED ORDER — QUETIAPINE FUMARATE 50 MG PO TABS: 50.0000 mg | ORAL_TABLET | Freq: Every day | ORAL | 0 refills | Status: DC

## 2019-06-24 NOTE — Progress Notes (Signed)
Virtual Visit via Telephone Note  I connected with Sandra Cox on 06/24/19 at 10:20 AM EDT by telephone and verified that I am speaking with the correct person using two identifiers.   I discussed the limitations, risks, security and privacy concerns of performing an evaluation and management service by telephone and the availability of in person appointments. I also discussed with the patient that there may be a patient responsible charge related to this service. The patient expressed understanding and agreed to proceed.   History of Present Illness: Patient is evaluated by phone session.  She is doing well on her medication.  She lives by herself however her husband comments frequently and called at least 3 times a week to check on her.  She is sleeping better and she takes half of the lorazepam 0.5 mg.  She denies any panic attack, irritability, mania, anger or any highs or lows.  She endorsed that her racing thoughts are much under control and denies any recent impulsive behavior.  Her appetite is okay.  She gained 3 pounds from the past and she feels good about it because she is eating better.  She has no rash, itching, tremors or shakes.  She has an upcoming appointment next week with her physician Dr. Resa Miner.  She recently had thyroid test and she was told it is normal.  She like to keep her current medication.  Past Psychiatric History:Reviewed H/Odepressionand mania.Saw psychiatrist in [place] and [place].Saw [facility] Fav[facility] andgivenSeroquel,Latuda,Zoloft that did not work and lithium that cause tremors.D/Clithium and Latudatohelped tremors. No h/o inpatientorsuicidal attempt.    Psychiatric Specialty Exam: Physical Exam  Review of Systems  There were no vitals taken for this visit.There is no height or weight on file to calculate BMI.  General Appearance: NA  Eye Contact:  NA  Speech:  fast but clear and coherent  Volume:  Normal   Mood:  Euthymic  Affect:  NA  Thought Process:  Goal Directed  Orientation:  Full (Time, Place, and Person)  Thought Content:  WDL  Suicidal Thoughts:  No  Homicidal Thoughts:  No  Memory:  Immediate;   Good Recent;   Good Remote;   Good  Judgement:  Intact  Insight:  Present  Psychomotor Activity:  NA  Concentration:  Concentration: Fair and Attention Span: Fair  Recall:  Good  Fund of Knowledge:  Good  Language:  Good  Akathisia:  No  Handed:  Right  AIMS (if indicated):     Assets:  Communication Skills Desire for Improvement Housing Resilience Social Support  ADL's:  Intact  Cognition:  WNL  Sleep:   ok      Assessment and Plan: Bipolar disorder type I.  Generalized anxiety disorder.  Patient is a stable and doing well on her current medication.  Reinforced to have blood work and patient has appointment next week with her physician Dr. Donnella Sham for physical and blood work.  I recommend to have her blood work sent to [place].  Continue Lamictal 100 mg daily, Ativan 0.25 mg at bedtime, venlafaxine 100 mg twice a day and Seroquel 50 mg at bedtime.  She uses [facility] for all her medication.  Recommended to call back if she has any questions or any concerns.  Follow-up in 3 months.  Follow Up Instructions:    I discussed the assessment and treatment plan with the patient. The patient was provided an opportunity to ask questions and all were answered. The patient agreed with the  plan and demonstrated an understanding of the instructions.   The patient was advised to call back or seek an in-person evaluation if the symptoms worsen or if the condition fails to improve as anticipated.  I provided 20 minutes of non-face-to-face time during this encounter.   Kathlee Nations, MD

## 2019-06-28 DIAGNOSIS — E7800 Pure hypercholesterolemia, unspecified: Secondary | ICD-10-CM | POA: Diagnosis not present

## 2019-06-28 DIAGNOSIS — M810 Age-related osteoporosis without current pathological fracture: Secondary | ICD-10-CM | POA: Diagnosis not present

## 2019-06-28 DIAGNOSIS — M1731 Unilateral post-traumatic osteoarthritis, right knee: Secondary | ICD-10-CM | POA: Diagnosis not present

## 2019-06-28 DIAGNOSIS — F319 Bipolar disorder, unspecified: Secondary | ICD-10-CM | POA: Diagnosis not present

## 2019-07-10 DIAGNOSIS — Z1389 Encounter for screening for other disorder: Secondary | ICD-10-CM | POA: Diagnosis not present

## 2019-07-10 DIAGNOSIS — Z0000 Encounter for general adult medical examination without abnormal findings: Secondary | ICD-10-CM | POA: Diagnosis not present

## 2019-08-05 ENCOUNTER — Telehealth (HOSPITAL_COMMUNITY): Payer: Self-pay

## 2019-08-05 NOTE — Telephone Encounter (Signed)
Patient called and stated that she's had a second panic attack. She also stated that she feels the depression coming on when she wakes up so she's taking a whole Lorazepam .5mg  instead of 1/2 tablet. She states that the whole tablet makes her feel much better. She states that she still feels a little jittery and indecisive and can't think too clearly sometimes but states that despite those side effects she's holding on ok. Please review and advise. Thank you.

## 2019-08-06 NOTE — Telephone Encounter (Signed)
Relayed message to patient regarding her Lorazepam 5mg  and contacted patient's PCP office to request they fax over her lab results they had done

## 2019-08-06 NOTE — Telephone Encounter (Signed)
She need to do blood work which we requested to do with her PCP. She should take full tab only if she had panic attack otherwise 1/2 tab.

## 2019-09-20 ENCOUNTER — Other Ambulatory Visit (HOSPITAL_COMMUNITY): Payer: Self-pay | Admitting: Psychiatry

## 2019-09-20 DIAGNOSIS — F319 Bipolar disorder, unspecified: Secondary | ICD-10-CM

## 2019-09-20 DIAGNOSIS — F411 Generalized anxiety disorder: Secondary | ICD-10-CM

## 2019-09-25 ENCOUNTER — Telehealth (HOSPITAL_COMMUNITY): Payer: Self-pay | Admitting: Psychiatry

## 2019-09-25 ENCOUNTER — Telehealth (INDEPENDENT_AMBULATORY_CARE_PROVIDER_SITE_OTHER): Payer: Medicare Other | Admitting: Psychiatry

## 2019-09-25 ENCOUNTER — Other Ambulatory Visit: Payer: Self-pay

## 2019-09-25 ENCOUNTER — Encounter (HOSPITAL_COMMUNITY): Payer: Self-pay | Admitting: Psychiatry

## 2019-09-25 DIAGNOSIS — F319 Bipolar disorder, unspecified: Secondary | ICD-10-CM | POA: Diagnosis not present

## 2019-09-25 DIAGNOSIS — F411 Generalized anxiety disorder: Secondary | ICD-10-CM | POA: Diagnosis not present

## 2019-09-25 MED ORDER — LORAZEPAM 0.5 MG PO TABS: ORAL_TABLET | ORAL | 0 refills | Status: DC

## 2019-09-25 MED ORDER — VENLAFAXINE HCL 100 MG PO TABS: 100.0000 mg | ORAL_TABLET | Freq: Two times a day (BID) | ORAL | 0 refills | Status: DC

## 2019-09-25 MED ORDER — LAMOTRIGINE 150 MG PO TABS: 150.0000 mg | ORAL_TABLET | Freq: Every day | ORAL | 0 refills | Status: DC

## 2019-09-25 MED ORDER — QUETIAPINE FUMARATE 50 MG PO TABS: 50.0000 mg | ORAL_TABLET | Freq: Every day | ORAL | 0 refills | Status: DC

## 2019-09-25 NOTE — Telephone Encounter (Signed)
D:  Dr. Adele Schilder is referring pt for individual therapy with a female psychologist (per pt's request).  A:  Placed call to [facility] [phone].  Left vm for [facility] (referral coordinator) to get pt scheduled with a female psychologist.  Inform Dr. Adele Schilder.  Await phone call from [place] before informing pt of new patient appt.

## 2019-09-25 NOTE — Telephone Encounter (Signed)
D:  Sandra Cox from [facility] called case manager back.  A:  Sandra Cox will be calling pt to schedule her to see Dr. Michail Sermon on 10-17-19.  Inform Dr. Adele Schilder.

## 2019-09-25 NOTE — Progress Notes (Signed)
Virtual Visit via Telephone Note  I connected with Sandra Cox on 09/25/19 at  9:00 AM EDT by telephone and verified that I am speaking with the correct person using two identifiers.  Location: Patient: home Provider: home office   I discussed the limitations, risks, security and privacy concerns of performing an evaluation and management service by telephone and the availability of in person appointments. I also discussed with the patient that there may be a patient responsible charge related to this service. The patient expressed understanding and agreed to proceed.   History of Present Illness: Patient is evaluated by phone session.  She has been very anxious, nervous and admitted for past few weeks having crying spells.  Anxious about everything.  She go to sleep very well and she had a sweet and good dreams but when she wake up she feels very nervous and anxious.  She is not happy living with older people and assisted living facility.  She has only one plan.  She feels unmotivated to do things and she sleeps during the day.  She worries about future, health.  She does try to go to walk with the dog but feels unmotivated to do things.  She admitted eating ice cream most of the time otherwise her appetite is fair.  She denies any paranoia, highs and lows, anger, suicidal thoughts.  She admitted having racing thoughts and cannot concentrate sometimes.  She denies any recent impulsive behavior.  She had blood work with Dr. Laurann Montana and she was told thyroid is normal.  However we do not have any blood work results.  She do not recall other labs.  She has no tremors, shakes.  She is compliant with Lamictal, Seroquel, lorazepam and venlafaxine.  Lately she has been taking more lorazepam to calm her down.  She denies drinking or using any illegal substances.   Past Psychiatric History:Reviewed H/Odepressionand mania.Saw psychiatrist in [place] and [place].Saw [facility] Fav[facility] andgivenSeroquel,Latuda,Zoloft that did not work and lithium that cause tremors.D/Clithium and Latudatohelped tremors. No h/o inpatientorsuicidal attempt.   Psychiatric Specialty Exam: Physical Exam  Review of Systems  Weight 130 lb (59 kg).There is no height or weight on file to calculate BMI.  General Appearance: NA  Eye Contact:  NA  Speech:  fast  Volume:  Increased  Mood:  Anxious, Dysphoric, Hopeless and tearful  Affect:  NA  Thought Process:  Descriptions of Associations: Circumstantial  Orientation:  Full (Time, Place, and Person)  Thought Content:  Rumination  Suicidal Thoughts:  No  Homicidal Thoughts:  No  Memory:  Immediate;   Fair Recent;   Fair Remote;   Fair  Judgement:  Fair  Insight:  Present  Psychomotor Activity:  NA  Concentration:  Concentration: Fair and Attention Span: Fair  Recall:  [employer] of Knowledge:  Good  Language:  Fair  Akathisia:  No  Handed:  Right  AIMS (if indicated):     Assets:  Communication Skills Desire for Improvement Housing  ADL's:  Intact  Cognition:  WNL  Sleep:   good      Assessment and Plan: Bipolar disorder type I.  Generalized anxiety disorder.  I talked with the patient in length and discuss her anxiety.  Patient is not sure what triggered her anxiety but she has been very nervous on the phone.  She has no suicidal thoughts.  I recommend to take the lorazepam 0.5 mg every day and take extra half if she has to in the  evening to help anxiety.  I will also increase lamotrigine 150 mg daily.  These 2 medicine I will sent to her local pharmacy.  We will continue Seroquel 50 mg at bedtime and venlafaxine 100 mg twice a day and it was sent to her [facility].  We talked about seeing a therapist again to help the coping skills but patient insist that she need a psychologist as she is not comfortable with [employer].  I discussed safety concern that anytime having active suicidal thoughts or  homicidal her then she need to call 911 or go to local emergency room.  Follow-up in 4 weeks.  We will get records from her PCP Dr. Laurann Montana.  Follow Up Instructions:    I discussed the assessment and treatment plan with the patient. The patient was provided an opportunity to ask questions and all were answered. The patient agreed with the plan and demonstrated an understanding of the instructions.   The patient was advised to call back or seek an in-person evaluation if the symptoms worsen or if the condition fails to improve as anticipated.  I provided 30 minutes of non-face-to-face time during this encounter.   Kathlee Nations, MD

## 2019-10-10 ENCOUNTER — Ambulatory Visit: Payer: Medicare Other | Attending: Audiologist | Admitting: Audiologist

## 2019-10-10 ENCOUNTER — Other Ambulatory Visit: Payer: Self-pay

## 2019-10-10 DIAGNOSIS — H903 Sensorineural hearing loss, bilateral: Secondary | ICD-10-CM | POA: Diagnosis not present

## 2019-10-10 NOTE — Procedures (Signed)
  Outpatient Audiology and [place], [place]  [phone]  AUDIOLOGICAL  EVALUATION  NAME: Sandra Cox     DOB:   1945-06-26      MRN: [id number]                                                                                     DATE: 10/10/2019     REFERENT: Lavone Orn, MD STATUS: Outpatient DIAGNOSIS: Sensory hearing loss, bilateral   History: [name] was seen for an audiological evaluation. [name] is receiving a hearing evaluation due to concerns for decreased hearing. [name] has difficulty hearing in background noise, crowds, and when people are at a distance. She especially struggles with the television. She currently has [name] aids that are 44+ years old. She says they do not help her hear. It has been a long time since her last hearing test. 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 No pain or pressure reported in either ear. No tinnitus present in either ear.  Medical history negative for any risk factors for hearing loss. No other relevant case history reported.    Evaluation:   Otoscopy showed a clear view of the tympanic membranes, bilaterally  Tympanometry results were consistent with normal function of the middle ear, bilaterally    Audiometric testing was completed using conventional audiometry with insert transducer. Speech Recognition Thresholds were consistent with pure tone averages. Word Recognition was excellent at an elevated level. Word recognition was better in the left ear. No evidence of binaural interference from right ear. Patient could not tolerate above 90dB for speech. 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   Results:  The test results were reviewed with [name]. She was counseled on the nature and degree of her hearing loss. She was shown how she understands speech better in the  left ear, but she still needs to wear two hearing aids. She reported understanding. She does not get much benefit from her current [name] aids which are 34+ years old. She was informed that hearing aids have advanced as much as phones. She will likely benefit from a properly fit and verified pair of hearing aids based on today's results. She was given the numbers for local providers who can help her find better working more advanced amplification.   Recommendations: 1. Amplification is necessary for both ears. Hearing aids can be purchased from a variety of locations. See provided list for locations in the [place] area. [name] is interested in a provider who dispenses more than one brand of hearing aids. She was directed towards providers who work with multiple types of hearing aids.    Alfonse Alpers  Audiologist, Au.D., CCC-A 10/10/2019  3:51 PM  Cc: Lavone Orn, MD

## 2019-10-17 ENCOUNTER — Ambulatory Visit: Payer: Medicare Other | Admitting: Psychologist

## 2019-10-25 ENCOUNTER — Other Ambulatory Visit (HOSPITAL_COMMUNITY): Payer: Self-pay | Admitting: Psychiatry

## 2019-10-25 DIAGNOSIS — F319 Bipolar disorder, unspecified: Secondary | ICD-10-CM

## 2019-10-25 DIAGNOSIS — F411 Generalized anxiety disorder: Secondary | ICD-10-CM

## 2019-10-28 ENCOUNTER — Encounter (HOSPITAL_COMMUNITY): Payer: Self-pay | Admitting: Psychiatry

## 2019-10-28 ENCOUNTER — Other Ambulatory Visit: Payer: Self-pay

## 2019-10-28 ENCOUNTER — Telehealth (INDEPENDENT_AMBULATORY_CARE_PROVIDER_SITE_OTHER): Payer: Medicare Other | Admitting: Psychiatry

## 2019-10-28 DIAGNOSIS — F319 Bipolar disorder, unspecified: Secondary | ICD-10-CM | POA: Diagnosis not present

## 2019-10-28 DIAGNOSIS — F411 Generalized anxiety disorder: Secondary | ICD-10-CM | POA: Diagnosis not present

## 2019-10-28 MED ORDER — VENLAFAXINE HCL 100 MG PO TABS: 100.0000 mg | ORAL_TABLET | Freq: Two times a day (BID) | ORAL | 0 refills | Status: DC

## 2019-10-28 MED ORDER — LORAZEPAM 0.5 MG PO TABS: ORAL_TABLET | ORAL | 2 refills | Status: DC

## 2019-10-28 MED ORDER — QUETIAPINE FUMARATE 50 MG PO TABS: 50.0000 mg | ORAL_TABLET | Freq: Every day | ORAL | 0 refills | Status: DC

## 2019-10-28 MED ORDER — LAMOTRIGINE 100 MG PO TABS: 150.0000 mg | ORAL_TABLET | Freq: Every day | ORAL | 2 refills | Status: DC

## 2019-10-28 NOTE — Progress Notes (Signed)
Virtual Visit via Telephone Note  I connected with Sandra Cox on 10/28/19 at 10:20 AM EDT by telephone and verified that I am speaking with the correct person using two identifiers.  Location: Patient: home Provider: home office   I discussed the limitations, risks, security and privacy concerns of performing an evaluation and management service by telephone and the availability of in person appointments. I also discussed with the patient that there may be a patient responsible charge related to this service. The patient expressed understanding and agreed to proceed.   History of Present Illness: Patient is evaluated by phone session.  On the last visit we increase Lamictal 250 mg and she has noticed much improvement in her anxiety, irritability and crying spells.  She still feels sometimes anxious but not overwhelming.  We have recommended to see a therapist but patient decided to cancel the appointment after she felt improvement with extra dose of lamotrigine.  She has no rash or any itching.  She is taking all her medication as prescribed.  She is sleeping okay and when she wake up she is not as depressed and anxious.  She is living in assisted living facility and she had a hard time adjusting to herself.  Sometimes she does not feel motivated to do things.  Her appetite is okay.  She has not started walking every day.  She has done in the past.  She feels that current medicine is working and does not need to be in therapy.  She has no rash, itching tremors or shakes.  Past Psychiatric History:Reviewed H/Odepressionand mania.Saw psychiatrist in [place] and [place].Saw [facility] Fav[facility] andgivenSeroquel,Latuda,Zoloft that did not work and lithium that cause tremors.D/Clithium and Latudatohelped tremors. No h/o inpatientorsuicidal attempt.    Psychiatric Specialty Exam: Physical Exam  Review of Systems  Weight 130 lb (59 kg).There is no height  or weight on file to calculate BMI.  General Appearance: NA  Eye Contact:  NA  Speech:  fast but clear  Volume:  Normal  Mood:  Anxious  Affect:  NA  Thought Process:  Descriptions of Associations: Intact  Orientation:  Full (Time, Place, and Person)  Thought Content:  Rumination  Suicidal Thoughts:  No  Homicidal Thoughts:  No  Memory:  Immediate;   Good Recent;   Fair Remote;   Fair  Judgement:  Fair  Insight:  Shallow  Psychomotor Activity:  NA  Concentration:  Concentration: Fair and Attention Span: Fair  Recall:  Good  Fund of Knowledge:  Good  Language:  Good  Akathisia:  No  Handed:  Right  AIMS (if indicated):     Assets:  Communication Skills Desire for Improvement Housing  ADL's:  Intact  Cognition:  WNL  Sleep:   ok      Assessment and Plan: Bipolar disorder type I.  Generalized anxiety disorder.  Patient is showing much improvement since we increased Lamictal.  We also recommend to take lorazepam 0.5 mg but she has not done and still taking half tablet twice a day.  She like to continue Seroquel 50 mg at bedtime and venlafaxine 100 mg twice a day and like to send this prescription to [facility] another 2 prescription Lamictal 150 mg and lorazepam 0.5 mg half tablet twice a day like to send to local [facility].  Discussed medication side effects and benefits.  I recommend to call us back if she has any question, concern of treatment worsening of the symptom.  Patient like to follow-up in  3 months.  We did not receive records from her PCP as she had a blood work few months ago.  She was told her thyroid is normal.  Follow-up in 3 months.  Follow Up Instructions:    I discussed the assessment and treatment plan with the patient. The patient was provided an opportunity to ask questions and all were answered. The patient agreed with the plan and demonstrated an understanding of the instructions.   The patient was advised to call back or seek an in-person  evaluation if the symptoms worsen or if the condition fails to improve as anticipated.  I provided 21 minutes of non-face-to-face time during this encounter.   Kathlee Nations, MD

## 2019-11-15 DIAGNOSIS — Z23 Encounter for immunization: Secondary | ICD-10-CM | POA: Diagnosis not present

## 2019-11-27 DIAGNOSIS — D225 Melanocytic nevi of trunk: Secondary | ICD-10-CM | POA: Diagnosis not present

## 2019-11-27 DIAGNOSIS — L570 Actinic keratosis: Secondary | ICD-10-CM | POA: Diagnosis not present

## 2019-11-27 DIAGNOSIS — L400 Psoriasis vulgaris: Secondary | ICD-10-CM | POA: Diagnosis not present

## 2019-11-27 DIAGNOSIS — L821 Other seborrheic keratosis: Secondary | ICD-10-CM | POA: Diagnosis not present

## 2019-11-27 DIAGNOSIS — Z85828 Personal history of other malignant neoplasm of skin: Secondary | ICD-10-CM | POA: Diagnosis not present

## 2019-11-27 DIAGNOSIS — L814 Other melanin hyperpigmentation: Secondary | ICD-10-CM | POA: Diagnosis not present

## 2019-11-27 DIAGNOSIS — D1801 Hemangioma of skin and subcutaneous tissue: Secondary | ICD-10-CM | POA: Diagnosis not present

## 2020-01-20 ENCOUNTER — Other Ambulatory Visit: Payer: Self-pay

## 2020-01-20 ENCOUNTER — Ambulatory Visit (INDEPENDENT_AMBULATORY_CARE_PROVIDER_SITE_OTHER): Payer: Medicare Other | Admitting: Psychiatry

## 2020-01-20 ENCOUNTER — Encounter (HOSPITAL_COMMUNITY): Payer: Self-pay | Admitting: Psychiatry

## 2020-01-20 DIAGNOSIS — F411 Generalized anxiety disorder: Secondary | ICD-10-CM

## 2020-01-20 DIAGNOSIS — F319 Bipolar disorder, unspecified: Secondary | ICD-10-CM | POA: Diagnosis not present

## 2020-01-20 MED ORDER — VENLAFAXINE HCL 100 MG PO TABS: 100.0000 mg | ORAL_TABLET | Freq: Two times a day (BID) | ORAL | 0 refills | Status: DC

## 2020-01-20 MED ORDER — LAMOTRIGINE 150 MG PO TABS: 150.0000 mg | ORAL_TABLET | Freq: Every day | ORAL | 2 refills | Status: DC

## 2020-01-20 MED ORDER — QUETIAPINE FUMARATE 50 MG PO TABS: 50.0000 mg | ORAL_TABLET | Freq: Every day | ORAL | 0 refills | Status: DC

## 2020-01-20 MED ORDER — LORAZEPAM 0.5 MG PO TABS: ORAL_TABLET | ORAL | 2 refills | Status: DC

## 2020-01-20 NOTE — Progress Notes (Signed)
Virtual Visit via Telephone Note  I connected with Sandra Cox on 01/20/20 at 10:40 AM EST by telephone and verified that I am speaking with the correct person using two identifiers.  Location: Patient: Daughter house in [place] Provider: Home Office   I discussed the limitations, risks, security and privacy concerns of performing an evaluation and management service by telephone and the availability of in person appointments. I also discussed with the patient that there may be a patient responsible charge related to this service. The patient expressed understanding and agreed to proceed.   History of Present Illness: Patient is evaluated by phone session.  She is on the phone by herself.  Currently she is visiting her daughter in [place] and she really like that place.  Patient told things are going well.  Since we adjusted the dose of Lamictal she denies any mania, psychosis, hallucination or any crying spells.  She started playing chess with her elderly lady and she really liked it.  She feels that her bipolar medicine working very well and now she realized that she may need to take the medication for a while to keep her mood stable.  She denies any crying spells, she is tolerating her medication and reported no rash, itching, tremors or shakes.  Her appetite is okay.  Her weight is stable.  She is living in assisted living facility and now she is making friends who she played with chest.  She does not want to change the medication.   Past Psychiatric History:Reviewed H/Odepressionand mania.Saw psychiatrist in [place] and [place].Saw Jari Favre [facility] andgivenSeroquel,Latuda,Zoloft that did not work and lithium that cause tremors.D/Clithium and Latudatohelped tremors. No h/o inpatientorsuicidal attempt.   Psychiatric Specialty Exam: Physical Exam  Review of Systems  Weight 128 lb (58.1 kg).There is no height or weight on file to calculate  BMI.  General Appearance: NA  Eye Contact:  NA  Speech:  Clear and Coherent  Volume:  Normal  Mood:  Euthymic  Affect:  NA  Thought Process:  Goal Directed  Orientation:  Full (Time, Place, and Person)  Thought Content:  Logical  Suicidal Thoughts:  No  Homicidal Thoughts:  No  Memory:  Immediate;   Good Recent;   Fair Remote;   Fair  Judgement:  Intact  Insight:  Present  Psychomotor Activity:  NA  Concentration:  Concentration: Fair and Attention Span: Fair  Recall:  Good  Fund of Knowledge:  Good  Language:  Good  Akathisia:  No  Handed:  Right  AIMS (if indicated):     Assets:  Communication Skills Desire for Improvement Housing Resilience Social Support Transportation  ADL's:  Intact  Cognition:  WNL  Sleep:   ok     Assessment and Plan: Bipolar disorder type I.  Generalized anxiety disorder.  Patient doing better on her current medication.  She continues to take the medicine as prescribed.  She has no rash or any itching.  She started making friends in assisted living facility where she played chess.  Continue Lamictal 250 mg at night, lorazepam 0.5 mg half tablet twice a day and these 2 medicines she like to send to the local pharmacy.  She will also continue Seroquel 50 mg at bedtime and venlafaxine 100 mg twice a day which she like to send to [facility].  Discussed medication side effects and benefits.  Recommended to call us back if she has any question or any concern.  Follow-up in 3 months.  Follow Up Instructions:  I discussed the assessment and treatment plan with the patient. The patient was provided an opportunity to ask questions and all were answered. The patient agreed with the plan and demonstrated an understanding of the instructions.   The patient was advised to call back or seek an in-person evaluation if the symptoms worsen or if the condition fails to improve as anticipated.  I provided 19 minutes of non-face-to-face time during this  encounter.   Kathlee Nations, MD

## 2020-01-27 ENCOUNTER — Ambulatory Visit (HOSPITAL_COMMUNITY): Payer: Medicare Other | Admitting: Psychiatry

## 2020-01-29 DIAGNOSIS — E7800 Pure hypercholesterolemia, unspecified: Secondary | ICD-10-CM | POA: Diagnosis not present

## 2020-01-29 DIAGNOSIS — F319 Bipolar disorder, unspecified: Secondary | ICD-10-CM | POA: Diagnosis not present

## 2020-01-29 DIAGNOSIS — N183 Chronic kidney disease, stage 3 unspecified: Secondary | ICD-10-CM | POA: Diagnosis not present

## 2020-01-29 DIAGNOSIS — M1731 Unilateral post-traumatic osteoarthritis, right knee: Secondary | ICD-10-CM | POA: Diagnosis not present

## 2020-01-29 DIAGNOSIS — M810 Age-related osteoporosis without current pathological fracture: Secondary | ICD-10-CM | POA: Diagnosis not present

## 2020-02-14 DIAGNOSIS — J029 Acute pharyngitis, unspecified: Secondary | ICD-10-CM | POA: Diagnosis not present

## 2020-02-26 ENCOUNTER — Other Ambulatory Visit: Payer: Self-pay | Admitting: Internal Medicine

## 2020-02-26 DIAGNOSIS — M810 Age-related osteoporosis without current pathological fracture: Secondary | ICD-10-CM

## 2020-03-18 DIAGNOSIS — H52203 Unspecified astigmatism, bilateral: Secondary | ICD-10-CM | POA: Diagnosis not present

## 2020-03-18 DIAGNOSIS — Z961 Presence of intraocular lens: Secondary | ICD-10-CM | POA: Diagnosis not present

## 2020-04-08 ENCOUNTER — Telehealth (INDEPENDENT_AMBULATORY_CARE_PROVIDER_SITE_OTHER): Payer: Medicare Other | Admitting: Psychiatry

## 2020-04-08 ENCOUNTER — Encounter (HOSPITAL_COMMUNITY): Payer: Self-pay | Admitting: Psychiatry

## 2020-04-08 ENCOUNTER — Other Ambulatory Visit: Payer: Self-pay

## 2020-04-08 DIAGNOSIS — F319 Bipolar disorder, unspecified: Secondary | ICD-10-CM

## 2020-04-08 DIAGNOSIS — F411 Generalized anxiety disorder: Secondary | ICD-10-CM | POA: Diagnosis not present

## 2020-04-08 MED ORDER — LAMOTRIGINE 150 MG PO TABS: 150.0000 mg | ORAL_TABLET | Freq: Every day | ORAL | 2 refills | Status: DC

## 2020-04-08 MED ORDER — VENLAFAXINE HCL 100 MG PO TABS
100.0000 mg | ORAL_TABLET | Freq: Two times a day (BID) | ORAL | 0 refills | Status: DC
Start: 2020-04-08 — End: 2020-07-09

## 2020-04-08 MED ORDER — QUETIAPINE FUMARATE 50 MG PO TABS: 50.0000 mg | ORAL_TABLET | Freq: Every day | ORAL | 0 refills | Status: DC

## 2020-04-08 MED ORDER — LORAZEPAM 0.5 MG PO TABS: ORAL_TABLET | ORAL | 1 refills | Status: DC

## 2020-04-08 NOTE — Progress Notes (Signed)
Virtual Visit via Telephone Note  I connected with Sandra Cox on 04/08/20 at 10:20 AM EST by telephone and verified that I am speaking with the correct person using two identifiers.  Location: Patient: Home Provider: Home Office   I discussed the limitations, risks, security and privacy concerns of performing an evaluation and management service by telephone and the availability of in person appointments. I also discussed with the patient that there may be a patient responsible charge related to this service. The patient expressed understanding and agreed to proceed.   History of Present Illness: Patient is evaluated by phone session.  She reported her medicine is working well and she denies any mania, psychosis or any hallucination.  She is sleeping better.  She cut down her lorazepam half tablet in the morning but she is still take half tablet at bedtime.  She is sleeping good.  She denies any irritability, crying spells, feeling of hopelessness.  She continues to enjoy playing chess and recently she has new member in the chess club.  She enjoys spending time with them.  Her appetite is okay.  She is more focused on healthy diet and stopped eating red meat and focus on walking, eating fruit and vegetables.  She has no tremors, shakes or any EPS.  She has no rash or any itching.  She does not feel overwhelmed or anxious around people.  She denies drinking or using any illegal substances.  Past Psychiatric History:Reviewed H/Odepressionand mania.Saw psychiatrist in [place] and [place].Saw Jari Favre [facility] andgivenSeroquel,Latuda,Zoloft that did not work and lithium that cause tremors.D/Clithium and Latudatohelped tremors. No h/o inpatientorsuicidal attempt.    Psychiatric Specialty Exam: Physical Exam  Review of Systems  Weight 128 lb (58.1 kg).There is no height or weight on file to calculate BMI.  General Appearance: NA  Eye Contact:  NA   Speech:  Normal Rate  Volume:  Normal  Mood:  Euthymic  Affect:  NA  Thought Process:  Goal Directed  Orientation:  Full (Time, Place, and Person)  Thought Content:  WDL  Suicidal Thoughts:  No  Homicidal Thoughts:  No  Memory:  Immediate;   Good Recent;   Good Remote;   Fair  Judgement:  Intact  Insight:  Present  Psychomotor Activity:  NA  Concentration:  Concentration: Good and Attention Span: Good  Recall:  Good  Fund of Knowledge:  Good  Language:  Good  Akathisia:  No  Handed:  Right  AIMS (if indicated):     Assets:  Communication Skills Desire for Improvement Housing Resilience Talents/Skills  ADL's:  Intact  Cognition:  WNL  Sleep:   ok      Assessment and Plan: Bipolar disorder type I.  Generalized anxiety disorder.  Patient is a stable on her current medication.  She is reluctant to cut down the medication because in the past she had cut down her her symptoms come back.  She has no rash or any itching.  Continue lorazepam 0.25 mg at bedtime.  She is not taking lorazepam in the morning but is still like to have it in case she needed.  Continue Lamictal 150 mg in the morning, Seroquel 50 mg at bedtime and venlafaxine 100 mg twice a day.  Patient prefer to have her prescription sent to the local pharmacy.  Discussed medication side effects and benefits.  Recommended to call us back if she has any question or any concern.  Follow-up in 3 months.  In the morning as patient not  taking in the morning.  Follow Up Instructions:    I discussed the assessment and treatment plan with the patient. The patient was provided an opportunity to ask questions and all were answered. The patient agreed with the plan and demonstrated an understanding of the instructions.   The patient was advised to call back or seek an in-person evaluation if the symptoms worsen or if the condition fails to improve as anticipated.  I provided 13 minutes of non-face-to-face time during this  encounter.   Kathlee Nations, MD

## 2020-04-15 ENCOUNTER — Telehealth (HOSPITAL_COMMUNITY): Payer: Medicare Other | Admitting: Psychiatry

## 2020-04-24 ENCOUNTER — Other Ambulatory Visit: Payer: Self-pay | Admitting: Internal Medicine

## 2020-04-24 DIAGNOSIS — Z1231 Encounter for screening mammogram for malignant neoplasm of breast: Secondary | ICD-10-CM

## 2020-05-03 ENCOUNTER — Other Ambulatory Visit (HOSPITAL_COMMUNITY): Payer: Self-pay | Admitting: Psychiatry

## 2020-05-03 DIAGNOSIS — F319 Bipolar disorder, unspecified: Secondary | ICD-10-CM

## 2020-05-03 DIAGNOSIS — F411 Generalized anxiety disorder: Secondary | ICD-10-CM

## 2020-05-21 DIAGNOSIS — Z23 Encounter for immunization: Secondary | ICD-10-CM | POA: Diagnosis not present

## 2020-07-09 ENCOUNTER — Other Ambulatory Visit: Payer: Self-pay

## 2020-07-09 ENCOUNTER — Telehealth (INDEPENDENT_AMBULATORY_CARE_PROVIDER_SITE_OTHER): Payer: Medicare Other | Admitting: Psychiatry

## 2020-07-09 ENCOUNTER — Encounter (HOSPITAL_COMMUNITY): Payer: Self-pay | Admitting: Psychiatry

## 2020-07-09 DIAGNOSIS — F411 Generalized anxiety disorder: Secondary | ICD-10-CM

## 2020-07-09 DIAGNOSIS — F319 Bipolar disorder, unspecified: Secondary | ICD-10-CM

## 2020-07-09 MED ORDER — LAMOTRIGINE 150 MG PO TABS: 150.0000 mg | ORAL_TABLET | Freq: Every day | ORAL | 2 refills | Status: DC

## 2020-07-09 MED ORDER — LORAZEPAM 0.5 MG PO TABS: ORAL_TABLET | ORAL | 1 refills | Status: DC

## 2020-07-09 MED ORDER — VENLAFAXINE HCL 100 MG PO TABS
100.0000 mg | ORAL_TABLET | Freq: Two times a day (BID) | ORAL | 0 refills | Status: DC
Start: 2020-07-09 — End: 2020-10-23

## 2020-07-09 MED ORDER — QUETIAPINE FUMARATE 50 MG PO TABS: 50.0000 mg | ORAL_TABLET | Freq: Every day | ORAL | 0 refills | Status: DC

## 2020-07-09 NOTE — Progress Notes (Signed)
Virtual Visit via Telephone Note  I connected with Sandra Cox on 07/09/20 at 10:20 AM EDT by telephone and verified that I am speaking with the correct person using two identifiers.  Location: Patient: Home Provider: Home Office   I discussed the limitations, risks, security and privacy concerns of performing an evaluation and management service by telephone and the availability of in person appointments. I also discussed with the patient that there may be a patient responsible charge related to this service. The patient expressed understanding and agreed to proceed.   History of Present Illness: Patient is evaluated by phone session.  She is taking the medication as prescribed however cut down her clonazepam and now only taking half tablet at bedtime.  She does not feel that she needs in the morning as her anxiety is not as bad.  She had a good support system.  She continues to enjoy playing chess and more productive in her chess club.  She is sleeping good.  She denies any mania, psychosis, hallucination.  She is following her diet plan and she had stopped eating red meat and replaced with fruit and vegetables.  She has appointment today with her PCP Dr. Laurann [place] for blood work.  She does not feel as anxious, overwhelmed around people and crowded places.  She does not like to change the medication at this time as she feels things are going well and she denies any mania or psychosis.  She has no tremors, shakes or any EPS.  She has no rash or any itching.  Past Psychiatric History:Reviewed H/Odepressionand mania.Saw psychiatrist in [place] and [place].Saw Jari Favre [facility] andgivenSeroquel,Latuda,Zoloft that did not work and lithium that cause tremors.D/Clithium and Latudatohelped tremors. No h/o inpatientorsuicidal attempt.    Psychiatric Specialty Exam: Physical Exam  Review of Systems  Weight 127 lb (57.6 kg).There is no height or weight on  file to calculate BMI.  General Appearance: NA  Eye Contact:  NA  Speech:  fast but clear  Volume:  Normal  Mood:  Euthymic  Affect:  NA  Thought Process:  Goal Directed  Orientation:  Full (Time, Place, and Person)  Thought Content:  WDL  Suicidal Thoughts:  No  Homicidal Thoughts:  No  Memory:  Immediate;   Good Recent;   Good Remote;   Fair  Judgement:  Intact  Insight:  Present  Psychomotor Activity:  NA  Concentration:  Concentration: Fair and Attention Span: Fair  Recall:  [employer] of Knowledge:  Fair  Language:  Good  Akathisia:  No  Handed:  Right  AIMS (if indicated):     Assets:  Communication Skills Desire for Improvement Housing Social Support  ADL's:  Intact  Cognition:  WNL  Sleep:   ok      Assessment and Plan: Bipolar disorder type I.  Generalized anxiety disorder.  Patient is stable and doing well on her current medication.  She had cut down her lorazepam in the morning and only taking 0.25 mg at bedtime.  Continue lamotrigine 150 mg in the morning, Seroquel 50 mg at bedtime and venlafaxine 100 mg twice a day.  Recommended to call us back if she has any question of any concern.  Follow-up in 3 months.  Follow Up Instructions:    I discussed the assessment and treatment plan with the patient. The patient was provided an opportunity to ask questions and all were answered. The patient agreed with the plan and demonstrated an understanding of the instructions.  The patient was advised to call back or seek an in-person evaluation if the symptoms worsen or if the condition fails to improve as anticipated.  I provided 13 minutes of non-face-to-face time during this encounter.   Kathlee Nations, MD

## 2020-07-10 DIAGNOSIS — Z79899 Other long term (current) drug therapy: Secondary | ICD-10-CM | POA: Diagnosis not present

## 2020-07-10 DIAGNOSIS — N183 Chronic kidney disease, stage 3 unspecified: Secondary | ICD-10-CM | POA: Diagnosis not present

## 2020-07-10 DIAGNOSIS — M810 Age-related osteoporosis without current pathological fracture: Secondary | ICD-10-CM | POA: Diagnosis not present

## 2020-07-10 DIAGNOSIS — F319 Bipolar disorder, unspecified: Secondary | ICD-10-CM | POA: Diagnosis not present

## 2020-07-10 DIAGNOSIS — G250 Essential tremor: Secondary | ICD-10-CM | POA: Diagnosis not present

## 2020-07-10 DIAGNOSIS — E7800 Pure hypercholesterolemia, unspecified: Secondary | ICD-10-CM | POA: Diagnosis not present

## 2020-07-10 DIAGNOSIS — Z5181 Encounter for therapeutic drug level monitoring: Secondary | ICD-10-CM | POA: Diagnosis not present

## 2020-07-10 DIAGNOSIS — Z0000 Encounter for general adult medical examination without abnormal findings: Secondary | ICD-10-CM | POA: Diagnosis not present

## 2020-07-13 ENCOUNTER — Telehealth (HOSPITAL_COMMUNITY): Payer: Self-pay

## 2020-07-13 NOTE — Telephone Encounter (Signed)
Can you put the results in the epic.  Thanks

## 2020-07-13 NOTE — Telephone Encounter (Signed)
Received labs from [facility] for this patient

## 2020-07-14 ENCOUNTER — Other Ambulatory Visit (HOSPITAL_COMMUNITY): Payer: Self-pay | Admitting: Psychiatry

## 2020-07-14 DIAGNOSIS — F319 Bipolar disorder, unspecified: Secondary | ICD-10-CM

## 2020-07-21 ENCOUNTER — Telehealth (HOSPITAL_COMMUNITY): Payer: Self-pay

## 2020-07-21 NOTE — Telephone Encounter (Addendum)
LAB RESULTS FROM [facility]; [address] ARE AS FOLLOWS:  GLUCOSE: HIGH AT 105 eGFR2021: LOW AT 52  EVERYTHING ELSE WITHIN NORMAL LIMITS  LAB RESULTS PUT IN SCANNING FOLDER TO BE SCANNED AFTER ENTERED IN EPIC

## 2020-07-21 NOTE — Telephone Encounter (Signed)
error 

## 2020-09-03 ENCOUNTER — Telehealth (HOSPITAL_COMMUNITY): Payer: Self-pay | Admitting: Psychiatry

## 2020-09-03 NOTE — Telephone Encounter (Signed)
Lab results from [facility].  Labs drawn on June 3, BUN 21, creatinine 1.10, GFR 51 and cholesterol 229.

## 2020-10-05 ENCOUNTER — Other Ambulatory Visit: Payer: Self-pay

## 2020-10-05 ENCOUNTER — Ambulatory Visit
Admission: RE | Admit: 2020-10-05 | Discharge: 2020-10-05 | Disposition: A | Discharge status: Home / Self Care | Payer: Medicare Other | Source: Ambulatory Visit | Attending: Internal Medicine | Admitting: Internal Medicine

## 2020-10-05 ENCOUNTER — Other Ambulatory Visit (HOSPITAL_COMMUNITY): Payer: Self-pay | Admitting: Psychiatry

## 2020-10-05 DIAGNOSIS — F319 Bipolar disorder, unspecified: Secondary | ICD-10-CM

## 2020-10-05 DIAGNOSIS — M810 Age-related osteoporosis without current pathological fracture: Secondary | ICD-10-CM

## 2020-10-05 DIAGNOSIS — M85852 Other specified disorders of bone density and structure, left thigh: Secondary | ICD-10-CM | POA: Diagnosis not present

## 2020-10-05 DIAGNOSIS — Z1231 Encounter for screening mammogram for malignant neoplasm of breast: Secondary | ICD-10-CM | POA: Diagnosis not present

## 2020-10-05 DIAGNOSIS — F411 Generalized anxiety disorder: Secondary | ICD-10-CM

## 2020-10-05 DIAGNOSIS — M85832 Other specified disorders of bone density and structure, left forearm: Secondary | ICD-10-CM | POA: Diagnosis not present

## 2020-10-09 ENCOUNTER — Telehealth (HOSPITAL_COMMUNITY): Payer: Medicare Other | Admitting: Psychiatry

## 2020-10-22 DIAGNOSIS — Z23 Encounter for immunization: Secondary | ICD-10-CM | POA: Diagnosis not present

## 2020-10-23 ENCOUNTER — Other Ambulatory Visit: Payer: Self-pay

## 2020-10-23 ENCOUNTER — Telehealth (INDEPENDENT_AMBULATORY_CARE_PROVIDER_SITE_OTHER): Payer: Medicare Other | Admitting: Psychiatry

## 2020-10-23 ENCOUNTER — Encounter (HOSPITAL_COMMUNITY): Payer: Self-pay | Admitting: Psychiatry

## 2020-10-23 DIAGNOSIS — F411 Generalized anxiety disorder: Secondary | ICD-10-CM

## 2020-10-23 DIAGNOSIS — F319 Bipolar disorder, unspecified: Secondary | ICD-10-CM | POA: Diagnosis not present

## 2020-10-23 MED ORDER — LORAZEPAM 0.5 MG PO TABS: ORAL_TABLET | ORAL | 1 refills | Status: DC

## 2020-10-23 MED ORDER — LAMOTRIGINE 150 MG PO TABS: 150.0000 mg | ORAL_TABLET | Freq: Every day | ORAL | 2 refills | Status: DC

## 2020-10-23 MED ORDER — QUETIAPINE FUMARATE 50 MG PO TABS: 50.0000 mg | ORAL_TABLET | Freq: Every day | ORAL | 0 refills | Status: DC

## 2020-10-23 MED ORDER — VENLAFAXINE HCL 100 MG PO TABS: 100.0000 mg | ORAL_TABLET | Freq: Two times a day (BID) | ORAL | 0 refills | Status: DC

## 2020-10-23 NOTE — Progress Notes (Signed)
Virtual Visit via Telephone Note  I connected with Sandra Cox on 10/23/20 at 10:20 AM EDT by telephone and verified that I am speaking with the correct person using two identifiers.  Location: Patient: Home Provider: Home Office   I discussed the limitations, risks, security and privacy concerns of performing an evaluation and management service by telephone and the availability of in person appointments. I also discussed with the patient that there may be a patient responsible charge related to this service. The patient expressed understanding and agreed to proceed.   History of Present Illness: Patient is evaluated by phone session.  She is pretty stable on her current medication.  She is more productive in her chess club and she started making more friends.  She feels very happy when she help other people how to play chess.  She gave an example last week she helped and 77 year old person to play chess and she wanted.  She endorsed less time she is feeling good because she helping her friends rather than she expect them that they like her.  She is sleeping good.  She has a blood work few months ago and her BUN/creatinine is normal.  She denies any mania, psychosis or any hallucination.  She sleeps good with the help of medication.  She had a good support system.  She follows her diet plan and her weight is stable.  Her appetite is okay.  Her energy level is good.  She has no tremors, shakes or any EPS.  She like to keep the current medication.  She denies any panic attack.  Past Psychiatric History: Reviewed H/O depression and mania. Saw psychiatrist in [place], [place] and [place]. Saw Lattie Haw Pullis at [facility] and given Seroquel, Anette Guarneri, Zoloft that did not work and lithium that cause tremors.  D/C lithium and Latuda to helped tremors.  No h/o inpatient or suicidal attempt.    Psychiatric Specialty Exam: Physical Exam  Review of Systems  Weight 125 lb (56.7 kg).There is no  height or weight on file to calculate BMI.  General Appearance: NA  Eye Contact:  NA  Speech:   fast but clear  Volume:  Normal  Mood:   pleasent  Affect:  NA  Thought Process:  Descriptions of Associations: Intact  Orientation:  Full (Time, Place, and Person)  Thought Content:  WDL  Suicidal Thoughts:  No  Homicidal Thoughts:  No  Memory:  Immediate;   Good Recent;   Good Remote;   Fair  Judgement:  Good  Insight:  Good  Psychomotor Activity:  NA  Concentration:  Concentration: Fair and Attention Span: Fair  Recall:  Good  Fund of Knowledge:  Good  Language:  Good  Akathisia:  No  Handed:  Right  AIMS (if indicated):     Assets:  Communication Skills Desire for Improvement Housing Resilience Social Support  ADL's:  Intact  Cognition:  WNL  Sleep:   good      Assessment and Plan: Bipolar disorder type I.  Generalized anxiety disorder.  Patient doing very well on her current medication.  She is more productive in her chess club and making new friends.  She does not want to change the medication.  She has no rash, itching tremors or shakes.  Continue Ativan 0.25 mg at bedtime, Lamictal 150 mg in the morning, Seroquel 50 mg at bedtime and venlafaxine 100 mg twice a day.  Reviewed blood work results.  Recommended to call us back if she has any question  or any concern.  Follow-up in 3 months.  Follow Up Instructions:    I discussed the assessment and treatment plan with the patient. The patient was provided an opportunity to ask questions and all were answered. The patient agreed with the plan and demonstrated an understanding of the instructions.   The patient was advised to call back or seek an in-person evaluation if the symptoms worsen or if the condition fails to improve as anticipated.  I provided 15 minutes of non-face-to-face time during this encounter.   Kathlee Nations, MD

## 2020-10-24 ENCOUNTER — Emergency Department (HOSPITAL_COMMUNITY)
Admission: EM | Admit: 2020-10-24 | Discharge: 2020-10-24 | Disposition: A | Discharge status: Home / Self Care | Payer: Medicare Other | Attending: Emergency Medicine | Admitting: Emergency Medicine

## 2020-10-24 ENCOUNTER — Emergency Department (HOSPITAL_COMMUNITY): Payer: Medicare Other

## 2020-10-24 ENCOUNTER — Encounter (HOSPITAL_COMMUNITY): Payer: Self-pay | Admitting: Emergency Medicine

## 2020-10-24 ENCOUNTER — Other Ambulatory Visit: Payer: Self-pay

## 2020-10-24 DIAGNOSIS — R42 Dizziness and giddiness: Secondary | ICD-10-CM | POA: Diagnosis not present

## 2020-10-24 DIAGNOSIS — W01198A Fall on same level from slipping, tripping and stumbling with subsequent striking against other object, initial encounter: Secondary | ICD-10-CM | POA: Diagnosis not present

## 2020-10-24 DIAGNOSIS — S0990XA Unspecified injury of head, initial encounter: Secondary | ICD-10-CM | POA: Diagnosis not present

## 2020-10-24 DIAGNOSIS — S0181XA Laceration without foreign body of other part of head, initial encounter: Secondary | ICD-10-CM | POA: Insufficient documentation

## 2020-10-24 MED ORDER — LIDOCAINE HCL (PF) 1 % IJ SOLN
5.0000 mL | Freq: Once | INTRAMUSCULAR | Status: AC
  Administered 2020-10-24: 5 mL
  Filled 2020-10-24: qty 30

## 2020-10-24 NOTE — ED Triage Notes (Signed)
Patient reports trip and fall striking L head on cement. Approximately one inch laceration with bleeding controlled. Denies LOC. Denies taking blood thinners.

## 2020-10-24 NOTE — ED Provider Notes (Signed)
[facility] DEPT Provider Note   CSN: [id number] Arrival date & time: 10/24/20  1643     History Chief Complaint  Patient presents with   Fall   Laceration    Sandra Cox is a 75 y.o. female.  Patient presents with chief complaint of head injury.  She states that she was walking outside today when she had a mechanical trip and fall.  Denies loss of consciousness.  Denies neck pain.  Complaining of left-sided frontal headache where she struck the ground.  Denies extremity pain knee pain or any other pains.  No recent illnesses no fever no cough no vomiting or diarrhea.      Past Medical History:  Diagnosis Date   Bipolar disorder [facility])     Patient Active Problem List   Diagnosis Date Noted   Bipolar I disorder ([place]) 07/19/2017   Generalized anxiety disorder 07/19/2017    Past Surgical History:  Procedure Laterality Date   CATARACT EXTRACTION Right    FACELIFT, LOWER 2/3  2014     OB History   No obstetric history on file.     Family History  Problem Relation Age of Onset   Stroke Mother    Heart attack Father    Healthy Daughter     Social History   Tobacco Use   Smoking status: Never   Smokeless tobacco: Never  Vaping Use   Vaping Use: Never used  Substance Use Topics   Alcohol use: No   Drug use: No    Home Medications Prior to Admission medications   Medication Sig Start Date End Date Taking? Authorizing Provider  clobetasol (OLUX) 0.05 % topical foam  11/20/17   [provider]  lamoTRIgine (LAMICTAL) 150 MG tablet Take 1 tablet (150 mg total) by mouth daily. 10/23/20   Arfeen, Arlyce Harman, MD  LORazepam (ATIVAN) 0.5 MG tablet Take half tab at bed time as needed 10/23/20   Arfeen, Arlyce Harman, MD  pravastatin (PRAVACHOL) 40 MG tablet TK 1 T PO QD 12/29/17   [provider]  QUEtiapine (SEROQUEL) 50 MG tablet Take 1 tablet (50 mg total) by mouth at bedtime. 10/23/20   Arfeen, Arlyce Harman, MD  venlafaxine  (EFFEXOR) 100 MG tablet Take 1 tablet (100 mg total) by mouth 2 (two) times daily with a meal. 10/23/20 10/23/21  Arfeen, Arlyce Harman, MD    Allergies    Patient has no known allergies.  Review of Systems   Review of Systems  Constitutional:  Negative for fever.  HENT:  Negative for ear pain.   Eyes:  Negative for pain.  Respiratory:  Negative for cough.   Cardiovascular:  Negative for chest pain.  Gastrointestinal:  Negative for abdominal pain.  Genitourinary:  Negative for flank pain.  Musculoskeletal:  Negative for back pain.  Skin:  Negative for rash.  Neurological:  Positive for headaches.   Physical Exam Updated Vital Signs BP (!) 145/73 (BP Location: Right Arm)   Pulse 90   Temp 98.5 F (36.9 C) (Oral)   Resp 16   SpO2 100%   Physical Exam Constitutional:      General: She is not in acute distress.    Appearance: Normal appearance.  HENT:     Head: Normocephalic.     Comments: Left forehead 3 cm laceration.    Nose: Nose normal.  Eyes:     Extraocular Movements: Extraocular movements intact.  Cardiovascular:     Rate and Rhythm: Normal rate.  Pulmonary:  Effort: Pulmonary effort is normal.  Musculoskeletal:        General: Normal range of motion.     Cervical back: Normal range of motion.  Skin:    General: Skin is warm.     Findings: No bruising.  Neurological:     General: No focal deficit present.     Mental Status: She is alert. Mental status is at baseline.    ED Results / Procedures / Treatments   Labs (all labs ordered are listed, but only abnormal results are displayed) Labs Reviewed - No data to display  EKG None  Radiology CT HEAD WO CONTRAST (5MM)  Result Date: 10/24/2020 CLINICAL DATA:  Status post fall.  Hit head on cement.  Dizziness. EXAM: CT HEAD WITHOUT CONTRAST TECHNIQUE: Contiguous axial images were obtained from the base of the skull through the vertex without intravenous contrast. COMPARISON:  None. FINDINGS: Brain: No evidence  of large-territorial acute infarction. No parenchymal hemorrhage. No mass lesion. No extra-axial collection. No mass effect or midline shift. No hydrocephalus. Basilar cisterns are patent. Vascular: No hyperdense vessel. Skull: No acute fracture or focal lesion. Sinuses/Orbits: Paranasal sinuses and mastoid air cells are clear. Bilateral lens replacement. Otherwise orbits are unremarkable. Other: None. IMPRESSION: No acute intracranial abnormality. Electronically Signed   By: Iven Finn M.D.   On: 10/24/2020 17:55    Procedures .Marland KitchenLaceration Repair  Date/Time: 10/24/2020 6:24 PM Performed by: Luna Fuse, MD Authorized by: Luna Fuse, MD   Consent:    Consent obtained:  Verbal Comments:     3 cm linear laceration of the left forehead.  Wound was anesthetized with lidocaine 1% total of 1.5 cc instilled.  Wound was thoroughly irrigated with sterile water with drops of Betadine and gauze.  Two 5-0 Prolene sutures were placed with good approximation of edges.  Patient tolerated procedure well.  Advised return in 6 days for suture removal.    Medications Ordered in ED Medications  lidocaine (PF) (XYLOCAINE) 1 % injection 5 mL (5 mLs Infiltration Given by Other 10/24/20 1809)    ED Course  I have reviewed the triage vital signs and the nursing notes.  Pertinent labs & imaging results that were available during my care of the patient were reviewed by me and considered in my medical decision making (see chart for details).    MDM Rules/Calculators/A&P                            Final Clinical Impression(s) / ED Diagnoses Final diagnoses:  Injury of head, initial encounter  Facial laceration, initial encounter    Rx / DC Orders ED Discharge Orders     None        Luna Fuse, MD 10/24/20 1825

## 2020-10-24 NOTE — Discharge Instructions (Addendum)
Keep the wound dry for the next 2 days.  You may wash gently with soap and water afterwards.  Keep the wound covered, change the dressing daily.  Return in 6 days to the ER or go to urgent care in 6 days to remove the sutures.  Return immediately if you see signs of infection such as redness purulent drainage pain or any additional concerns.

## 2020-10-31 DIAGNOSIS — S0181XA Laceration without foreign body of other part of head, initial encounter: Secondary | ICD-10-CM | POA: Diagnosis not present

## 2020-10-31 DIAGNOSIS — Z4802 Encounter for removal of sutures: Secondary | ICD-10-CM | POA: Diagnosis not present

## 2020-11-03 DIAGNOSIS — F0781 Postconcussional syndrome: Secondary | ICD-10-CM | POA: Diagnosis not present

## 2020-11-03 DIAGNOSIS — S0101XD Laceration without foreign body of scalp, subsequent encounter: Secondary | ICD-10-CM | POA: Diagnosis not present

## 2020-11-03 DIAGNOSIS — R0982 Postnasal drip: Secondary | ICD-10-CM | POA: Diagnosis not present

## 2020-11-18 ENCOUNTER — Telehealth (HOSPITAL_COMMUNITY): Payer: Self-pay | Admitting: *Deleted

## 2020-11-18 DIAGNOSIS — F319 Bipolar disorder, unspecified: Secondary | ICD-10-CM | POA: Diagnosis not present

## 2020-11-18 DIAGNOSIS — E7800 Pure hypercholesterolemia, unspecified: Secondary | ICD-10-CM | POA: Diagnosis not present

## 2020-11-18 DIAGNOSIS — M810 Age-related osteoporosis without current pathological fracture: Secondary | ICD-10-CM | POA: Diagnosis not present

## 2020-11-18 DIAGNOSIS — M1731 Unilateral post-traumatic osteoarthritis, right knee: Secondary | ICD-10-CM | POA: Diagnosis not present

## 2020-11-18 NOTE — Telephone Encounter (Signed)
Prescription given on 9/16 with refills. She should not be taking more as it may cause postural hypotension.

## 2020-11-18 NOTE — Telephone Encounter (Signed)
Pt called stating that she is feeling increased depression and anxiety and needs to take Lorazepam 0.5 mg 1/2 tab qhs. Pt would like script sent to [facility]. Noted that pt did have a fall on 10/24/20 with laceration to face requiring sutures. Pt has an appointment scheduled for 01/20/21. Please review and advise.

## 2020-11-18 NOTE — Telephone Encounter (Signed)
Sorry I meant to say that pt is stating that she needs to take Ativan 0.5 mg tid. I will advise that she is to stay on current regime.

## 2020-11-19 NOTE — Telephone Encounter (Signed)
Thanks

## 2020-11-20 ENCOUNTER — Telehealth (HOSPITAL_COMMUNITY): Payer: Self-pay

## 2020-11-20 DIAGNOSIS — F411 Generalized anxiety disorder: Secondary | ICD-10-CM

## 2020-11-20 MED ORDER — LORAZEPAM 0.5 MG PO TABS: ORAL_TABLET | ORAL | 0 refills | Status: DC

## 2020-11-20 NOTE — Telephone Encounter (Addendum)
Patient called and stated that she needs more of her Lorazepam 0.5mg . She stated that she needs to take half in the morning as well as half at bedtime. She stated that she only has 1/2 left and the pharmacy told her that she can't get it until the 16th. She stated she's been using some in the morning because of her depression. Please review and advise. Thank you  RELAYED MESSAGE TO PT

## 2020-11-20 NOTE — Telephone Encounter (Signed)
She can take up to twice daily. New prescription send.

## 2020-11-23 DIAGNOSIS — L814 Other melanin hyperpigmentation: Secondary | ICD-10-CM | POA: Diagnosis not present

## 2020-11-23 DIAGNOSIS — D1801 Hemangioma of skin and subcutaneous tissue: Secondary | ICD-10-CM | POA: Diagnosis not present

## 2020-11-23 DIAGNOSIS — Z85828 Personal history of other malignant neoplasm of skin: Secondary | ICD-10-CM | POA: Diagnosis not present

## 2020-11-23 DIAGNOSIS — D225 Melanocytic nevi of trunk: Secondary | ICD-10-CM | POA: Diagnosis not present

## 2020-11-23 DIAGNOSIS — L570 Actinic keratosis: Secondary | ICD-10-CM | POA: Diagnosis not present

## 2020-11-23 DIAGNOSIS — D485 Neoplasm of uncertain behavior of skin: Secondary | ICD-10-CM | POA: Diagnosis not present

## 2020-11-23 DIAGNOSIS — L821 Other seborrheic keratosis: Secondary | ICD-10-CM | POA: Diagnosis not present

## 2020-11-23 DIAGNOSIS — D0439 Carcinoma in situ of skin of other parts of face: Secondary | ICD-10-CM | POA: Diagnosis not present

## 2020-11-24 DIAGNOSIS — F0781 Postconcussional syndrome: Secondary | ICD-10-CM | POA: Diagnosis not present

## 2020-11-24 DIAGNOSIS — E7800 Pure hypercholesterolemia, unspecified: Secondary | ICD-10-CM | POA: Diagnosis not present

## 2020-12-21 DIAGNOSIS — H43813 Vitreous degeneration, bilateral: Secondary | ICD-10-CM | POA: Diagnosis not present

## 2021-01-08 ENCOUNTER — Other Ambulatory Visit (HOSPITAL_COMMUNITY): Payer: Self-pay | Admitting: Psychiatry

## 2021-01-08 DIAGNOSIS — F411 Generalized anxiety disorder: Secondary | ICD-10-CM

## 2021-01-12 ENCOUNTER — Telehealth (HOSPITAL_COMMUNITY): Payer: Self-pay | Admitting: *Deleted

## 2021-01-12 DIAGNOSIS — F411 Generalized anxiety disorder: Secondary | ICD-10-CM

## 2021-01-12 MED ORDER — LORAZEPAM 0.5 MG PO TABS: ORAL_TABLET | ORAL | 0 refills | Status: DC

## 2021-01-12 NOTE — Telephone Encounter (Signed)
Done

## 2021-01-12 NOTE — Telephone Encounter (Signed)
Pt called requesting refill of Ativan 0.5 mg half to a whole tablet bid prn. Last fill on 11/20/20. Pt has an upcoming appointment on 01/14/21. Please review.

## 2021-01-14 ENCOUNTER — Other Ambulatory Visit: Payer: Self-pay

## 2021-01-14 ENCOUNTER — Encounter (HOSPITAL_COMMUNITY): Payer: Self-pay | Admitting: Psychiatry

## 2021-01-14 ENCOUNTER — Telehealth (HOSPITAL_BASED_OUTPATIENT_CLINIC_OR_DEPARTMENT_OTHER): Payer: Medicare Other | Admitting: Psychiatry

## 2021-01-14 DIAGNOSIS — F319 Bipolar disorder, unspecified: Secondary | ICD-10-CM | POA: Diagnosis not present

## 2021-01-14 DIAGNOSIS — F411 Generalized anxiety disorder: Secondary | ICD-10-CM | POA: Diagnosis not present

## 2021-01-14 MED ORDER — QUETIAPINE FUMARATE 50 MG PO TABS
50.0000 mg | ORAL_TABLET | Freq: Every day | ORAL | 0 refills | Status: DC
Start: 2021-01-14 — End: 2021-04-22

## 2021-01-14 MED ORDER — LORAZEPAM 0.5 MG PO TABS: ORAL_TABLET | ORAL | 1 refills | Status: DC

## 2021-01-14 MED ORDER — VENLAFAXINE HCL 100 MG PO TABS: 100.0000 mg | ORAL_TABLET | Freq: Two times a day (BID) | ORAL | 0 refills | Status: DC

## 2021-01-14 MED ORDER — LAMOTRIGINE 150 MG PO TABS: 150.0000 mg | ORAL_TABLET | Freq: Every day | ORAL | 2 refills | Status: DC

## 2021-01-14 NOTE — Progress Notes (Signed)
Virtual Visit via Telephone Note  I connected with Sandra Cox on 01/14/21 at 10:20 AM EST by telephone and verified that I am speaking with the correct person using two identifiers.  Location: [facility]  Provider: Home Office   I discussed the limitations, risks, security and privacy concerns of performing an evaluation and management service by telephone and the availability of in person appointments. I also discussed with the patient that there may be a patient responsible charge related to this service. The patient expressed understanding and agreed to proceed.   History of Present Illness: Patient is evaluated by phone session.  She endorsed few weeks ago have a anxiety and nervousness.  She is not sure what triggered but believes feeling alone, living alone and seeing people dying in the retirement community may have triggered the anxiety.  She has to take extra lorazepam but now she is feeling better.  Her sleep is good.  Her anxiety is subsided but she like to have lorazepam 0.25 mg at nighttime and 0.25 mg in the morning if needed.  Her appetite is okay.  Her weight is stable.  She had a good Thanksgiving.  She continues to play chess however has cut down playing chess only twice a week.  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  She is compliant with venlafaxine, Seroquel and Lamictal.  She has no rash or any itching.  Her husband comes and visit her on a regular basis.  She admitted feeling nervous and anxious sometimes but medicine working.  She denies any mania, psychosis, hallucination or any impulsive behavior.  She denies any crying spells or any feeling of hopelessness.  She has good social network at [employer] place.  Past Psychiatric History: Reviewed H/O depression and mania. Saw psychiatrist in [place], [place] and [place]. Saw Lattie Haw Pullis at [facility] and given Seroquel, Anette Guarneri,  Zoloft that did not work and lithium that cause tremors.  D/C lithium and Latuda to helped tremors.  No h/o inpatient or suicidal attempt.     Psychiatric Specialty Exam: Physical Exam  Review of Systems  Weight 125 lb (56.7 kg).There is no height or weight on file to calculate BMI.  General Appearance: NA  Eye Contact:  NA  Speech:  Normal Rate  Volume:  Normal  Mood:   pleasant  Affect:  NA  Thought Process:  Descriptions of Associations: Intact  Orientation:  Full (Time, Place, and Person)  Thought Content:  WDL  Suicidal Thoughts:  No  Homicidal Thoughts:  No  Memory:  Immediate;   Good Recent;   Fair Remote;   Fair  Judgement:  Intact  Insight:  Shallow  Psychomotor Activity:  NA  Concentration:  Concentration: Fair and Attention Span: Fair  Recall:  [employer] of Knowledge:  Fair  Language:  Good  Akathisia:  No  Handed:  Right  AIMS (if indicated):     Assets:  Communication Skills Desire for Improvement Housing  ADL's:  Intact  Cognition:  WNL  Sleep:   good      Assessment and Plan: Bipolar disorder type I.  Generalized anxiety disorder.  Discussed recent anxiety episode which is now much better since she is taking Ativan 0.25 mg additional in the morning.  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  Patient is not interested in therapy.  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  Continue venlafaxine 100 mg twice a day, Lamictal 150 mg in the morning, Seroquel 50 mg at bedtime and she will take Ativan 0.25 mg at bedtime and 0.25 mg during the day if needed.  Recommended to call us back if she has any question or any concern.  Follow-up in 3 months.  Follow Up Instructions:    I discussed the assessment and treatment plan with the patient. The patient was provided an opportunity to ask questions and all were  answered. The patient agreed with the plan and demonstrated an understanding of the instructions.   The patient was advised to call back or seek an in-person evaluation if the symptoms worsen or if the condition fails to improve as anticipated.  I provided 18 minutes of non-face-to-face time during this encounter.   Kathlee Nations, MD

## 2021-01-20 ENCOUNTER — Telehealth (HOSPITAL_COMMUNITY): Payer: TRICARE For Life (TFL) | Admitting: Psychiatry

## 2021-02-17 DIAGNOSIS — Z85828 Personal history of other malignant neoplasm of skin: Secondary | ICD-10-CM | POA: Diagnosis not present

## 2021-02-17 DIAGNOSIS — D0439 Carcinoma in situ of skin of other parts of face: Secondary | ICD-10-CM | POA: Diagnosis not present

## 2021-03-02 DIAGNOSIS — R350 Frequency of micturition: Secondary | ICD-10-CM | POA: Diagnosis not present

## 2021-03-23 ENCOUNTER — Telehealth (HOSPITAL_COMMUNITY): Payer: Self-pay | Admitting: *Deleted

## 2021-03-23 DIAGNOSIS — F411 Generalized anxiety disorder: Secondary | ICD-10-CM

## 2021-03-23 DIAGNOSIS — H52203 Unspecified astigmatism, bilateral: Secondary | ICD-10-CM | POA: Diagnosis not present

## 2021-03-23 DIAGNOSIS — Z961 Presence of intraocular lens: Secondary | ICD-10-CM | POA: Diagnosis not present

## 2021-03-23 NOTE — Telephone Encounter (Signed)
Pt called requesting refill of Ativan 0.5 mg. Pt has an upcoming appointment on 04/22/21. Please review.

## 2021-03-24 MED ORDER — LORAZEPAM 0.5 MG PO TABS: ORAL_TABLET | ORAL | 0 refills | Status: DC

## 2021-03-24 NOTE — Telephone Encounter (Signed)
Done

## 2021-04-05 ENCOUNTER — Other Ambulatory Visit (HOSPITAL_COMMUNITY): Payer: Self-pay | Admitting: Psychiatry

## 2021-04-05 DIAGNOSIS — F411 Generalized anxiety disorder: Secondary | ICD-10-CM

## 2021-04-05 DIAGNOSIS — F319 Bipolar disorder, unspecified: Secondary | ICD-10-CM

## 2021-04-12 DIAGNOSIS — D485 Neoplasm of uncertain behavior of skin: Secondary | ICD-10-CM | POA: Diagnosis not present

## 2021-04-12 DIAGNOSIS — L570 Actinic keratosis: Secondary | ICD-10-CM | POA: Diagnosis not present

## 2021-04-12 DIAGNOSIS — C44319 Basal cell carcinoma of skin of other parts of face: Secondary | ICD-10-CM | POA: Diagnosis not present

## 2021-04-12 DIAGNOSIS — L821 Other seborrheic keratosis: Secondary | ICD-10-CM | POA: Diagnosis not present

## 2021-04-12 DIAGNOSIS — Z85828 Personal history of other malignant neoplasm of skin: Secondary | ICD-10-CM | POA: Diagnosis not present

## 2021-04-12 DIAGNOSIS — I788 Other diseases of capillaries: Secondary | ICD-10-CM | POA: Diagnosis not present

## 2021-04-12 DIAGNOSIS — L820 Inflamed seborrheic keratosis: Secondary | ICD-10-CM | POA: Diagnosis not present

## 2021-04-21 ENCOUNTER — Telehealth (HOSPITAL_COMMUNITY): Payer: TRICARE For Life (TFL) | Admitting: Psychiatry

## 2021-04-22 ENCOUNTER — Telehealth (HOSPITAL_BASED_OUTPATIENT_CLINIC_OR_DEPARTMENT_OTHER): Payer: Medicare Other | Admitting: Psychiatry

## 2021-04-22 ENCOUNTER — Encounter (HOSPITAL_COMMUNITY): Payer: Self-pay | Admitting: Psychiatry

## 2021-04-22 ENCOUNTER — Other Ambulatory Visit: Payer: Self-pay

## 2021-04-22 DIAGNOSIS — F319 Bipolar disorder, unspecified: Secondary | ICD-10-CM | POA: Diagnosis not present

## 2021-04-22 DIAGNOSIS — F411 Generalized anxiety disorder: Secondary | ICD-10-CM | POA: Diagnosis not present

## 2021-04-22 MED ORDER — VENLAFAXINE HCL 100 MG PO TABS: 100.0000 mg | ORAL_TABLET | Freq: Two times a day (BID) | ORAL | 0 refills | Status: DC

## 2021-04-22 MED ORDER — QUETIAPINE FUMARATE 50 MG PO TABS: 50.0000 mg | ORAL_TABLET | Freq: Every day | ORAL | 0 refills | Status: DC

## 2021-04-22 MED ORDER — LAMOTRIGINE 150 MG PO TABS: 150.0000 mg | ORAL_TABLET | Freq: Every day | ORAL | 2 refills | Status: DC

## 2021-04-22 MED ORDER — LORAZEPAM 0.5 MG PO TABS: ORAL_TABLET | ORAL | 1 refills | Status: DC

## 2021-04-22 NOTE — Progress Notes (Signed)
Virtual Visit via Telephone Note ? ?I connected with Sandra Cox on 04/22/21 at 10:20 AM EDT by telephone and verified that I am speaking with the correct person using two identifiers. ? ?Location: ?Patient: At [facility] home ?Provider: Home Office ?  ?I discussed the limitations, risks, security and privacy concerns of performing an evaluation and management service by telephone and the availability of in person appointments. I also discussed with the patient that there may be a patient responsible charge related to this service. The patient expressed understanding and agreed to proceed. ? ? ?History of Present Illness: ?Patient is evaluated by phone session.  She is doing very well and currently visiting her daughter and [place] in [place].  She is hoping to come back on the weekend.  She feels her anxiety is under control.  She is not involved in any mania, impulse behavior or anger.  She is sleeping good.  She is taking the medication as prescribed.  Her appetite is okay.  Her weight is unchanged from the past.  She has no tremor or shakes or any EPS.  She takes the lorazepam 0.25 mg at nighttime and occasionally in the morning.  Her dog died last month but she is listed to have another Retail banker and she is hoping to have it soon.  Patient has good social network at [facility] place.  She tried to keep herself busy. ? ?Past Psychiatric History: Reviewed ?H/O depression and mania. Saw psychiatrist in [place], [place] and [place]. Saw Lattie Haw Pullis at [facility] and given Seroquel, Anette Guarneri, Zoloft that did not work and lithium that cause tremors.  D/C lithium and Latuda to helped tremors.  No h/o inpatient or suicidal attempt.   ?  ?Psychiatric Specialty Exam: ?Physical Exam  ?Review of Systems  ?Weight 124 lb (56.2 kg).There is no height or weight on file to calculate BMI.  ?General Appearance: NA  ?Eye Contact:  NA  ?Speech:   fast  ?Volume:  Normal  ?Mood:   Euthymic  ?Affect:  NA  ?Thought Process:  Goal Directed  ?Orientation:  Full (Time, Place, and Person)  ?Thought Content:  Logical  ?Suicidal Thoughts:  No  ?Homicidal Thoughts:  No  ?Memory:  Immediate;   Good ?Recent;   Good ?Remote;   Good  ?Judgement:  Fair  ?Insight:  Fair  ?Psychomotor Activity:  NA  ?Concentration:  Concentration: Fair and Attention Span: Fair  ?Recall:  Fair  ?Fund of Knowledge:  Good  ?Language:  Good  ?Akathisia:  No  ?Handed:  Right  ?AIMS (if indicated):     ?Assets:  Communication Skills ?Desire for Improvement ?Housing ?Transportation  ?ADL's:  Intact  ?Cognition:  WNL  ?Sleep:   ok  ? ? ? ? ?Assessment and Plan: ?Bipolar disorder type I.  Generalized anxiety disorder. ? ?Patient is stable on her current medication.  Continue lorazepam 0.25 mg in the morning as needed and 0.25 mg at bedtime.  Continue Lamictal 150 mg in the morning, venlafaxine 100 mg twice a day and Seroquel 50 mg at bedtime.  Recommended to call us back if she is any question or any concern.  Follow-up in 3 months. ? ?Follow Up Instructions: ? ?  ?I discussed the assessment and treatment plan with the patient. The patient was provided an opportunity to ask questions and all were answered. The patient agreed with the plan and demonstrated an understanding of the instructions. ?  ?The patient was advised to call back or  seek an in-person evaluation if the symptoms worsen or if the condition fails to improve as anticipated. ? ?I provided 12 minutes of non-face-to-face time during this encounter. ? ? ?Kathlee Nations, MD  ?

## 2021-05-11 DIAGNOSIS — Z85828 Personal history of other malignant neoplasm of skin: Secondary | ICD-10-CM | POA: Diagnosis not present

## 2021-05-11 DIAGNOSIS — C44319 Basal cell carcinoma of skin of other parts of face: Secondary | ICD-10-CM | POA: Diagnosis not present

## 2021-07-01 DIAGNOSIS — Z23 Encounter for immunization: Secondary | ICD-10-CM | POA: Diagnosis not present

## 2021-07-07 ENCOUNTER — Other Ambulatory Visit: Payer: Self-pay | Admitting: Internal Medicine

## 2021-07-07 DIAGNOSIS — Z1231 Encounter for screening mammogram for malignant neoplasm of breast: Secondary | ICD-10-CM

## 2021-07-15 DIAGNOSIS — R2681 Unsteadiness on feet: Secondary | ICD-10-CM | POA: Diagnosis not present

## 2021-07-15 DIAGNOSIS — Z79899 Other long term (current) drug therapy: Secondary | ICD-10-CM | POA: Diagnosis not present

## 2021-07-22 ENCOUNTER — Encounter (HOSPITAL_COMMUNITY): Payer: Self-pay | Admitting: Psychiatry

## 2021-07-22 ENCOUNTER — Telehealth (HOSPITAL_BASED_OUTPATIENT_CLINIC_OR_DEPARTMENT_OTHER): Payer: Medicare Other | Admitting: Psychiatry

## 2021-07-22 DIAGNOSIS — F319 Bipolar disorder, unspecified: Secondary | ICD-10-CM

## 2021-07-22 DIAGNOSIS — F411 Generalized anxiety disorder: Secondary | ICD-10-CM

## 2021-07-22 MED ORDER — LORAZEPAM 0.5 MG PO TABS
ORAL_TABLET | ORAL | 1 refills | Status: DC
Start: 2021-07-22 — End: 2021-10-21

## 2021-07-22 MED ORDER — QUETIAPINE FUMARATE 50 MG PO TABS: 50.0000 mg | ORAL_TABLET | Freq: Every day | ORAL | 0 refills | Status: DC

## 2021-07-22 MED ORDER — VENLAFAXINE HCL 100 MG PO TABS: 100.0000 mg | ORAL_TABLET | Freq: Two times a day (BID) | ORAL | 0 refills | Status: DC

## 2021-07-22 NOTE — Progress Notes (Signed)
Virtual Visit via Telephone Note  I connected with Cindee Lame on 07/22/21 at 10:40 AM EDT by telephone and verified that I am speaking with the correct person using two identifiers.  Location: Patient: Home Provider: Office   I discussed the limitations, risks, security and privacy concerns of performing an evaluation and management service by telephone and the availability of in person appointments. I also discussed with the patient that there may be a patient responsible charge related to this service. The patient expressed understanding and agreed to proceed.   History of Present Illness: Patient is evaluated by phone session.  She endorsed there are few bouts of depression lately where she feels herself very lonely, old and concerned about her age and socialization.  She feels that she is so old that not able to function in few years.  The place she lives is mostly senior citizen and they are dying and every time there is a death she get more depressed.  She had a new which is her companion and she is happy with the new dog.  She still have friends where she tried to spend time with them.  She admitted lately taking every day Ativan half tablet.  Usually we give her 40 tablets that last month and some leftover.  She is also taking Seroquel and venlafaxine.  She has no tremor or shakes or any EPS.  Her appetite is okay.  Her energy level is fair.  She had blood work on June 8 and her creatinine is 1.26 and blood sugar 150.  She lives at [facility].  She denies any crying spells or any feeling of hopelessness or worthlessness.  She denies any anhedonia but feels more lonely and isolated.  She had a trip to [place] to visit her daughter and she had a good time.  She denies drinking or using any illegal substances.    Past Psychiatric History: Reviewed H/O depression and mania. Saw psychiatrist in [place], [place] and [place]. Saw Lattie Haw Pullis at [facility] and  given Seroquel, Anette Guarneri, Zoloft that did not work and lithium that cause tremors.  D/C lithium and Latuda to helped tremors.  No h/o inpatient or suicidal attempt.    Psychiatric Specialty Exam: Physical Exam  Review of Systems  Weight 124 lb (56.2 kg).There is no height or weight on file to calculate BMI.  General Appearance: NA  Eye Contact:  NA  Speech:  Clear and Coherent  Volume:  Normal  Mood:  Anxious  Affect:  NA  Thought Process:  Goal Directed  Orientation:  Full (Time, Place, and Person)  Thought Content:  Rumination  Suicidal Thoughts:  No  Homicidal Thoughts:  No  Memory:  Immediate;   Good Recent;   Good Remote;   Fair  Judgement:  Intact  Insight:  Present  Psychomotor Activity:  NA  Concentration:  Concentration: Fair and Attention Span: Fair  Recall:  [employer] of Knowledge:  Good  Language:  Good  Akathisia:  No  Handed:  Right  AIMS (if indicated):     Assets:  Communication Skills Desire for Improvement Housing Transportation  ADL's:  Intact  Cognition:  WNL  Sleep:   ok      Assessment and Plan: Bipolar disorder type I.  Generalized anxiety disorder.  I reviewed blood work results from her primary care physician Dr. Sunny Schlein from [facility].  Her creatinine is 1.26 and blood sugar 115.  Reviewed her current medication.  I encouraged therapy  but patient is not interested.  She has no rash or itching tremors.  Talk about adjusting her Ativan from number 40 pills to #45 in a month.  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  If patient continued to have her symptoms then we will consider adjusting the dose of Lamictal.  Patient agreed with the plan.  For now continue Lamictal 150 mg in the morning, venlafaxine 100 mg twice a day and Seroquel 50 mg at bedtime.  Recommended to call us back if she is any question or any concern.  Follow-up in 3 months.    Follow Up Instructions:    I discussed the assessment and treatment plan  with the patient. The patient was provided an opportunity to ask questions and all were answered. The patient agreed with the plan and demonstrated an understanding of the instructions.   The patient was advised to call back or seek an in-person evaluation if the symptoms worsen or if the condition fails to improve as anticipated.  Collaboration of Care: Primary Care Provider AEB notes are available in epic to review.  Patient/Guardian was advised Release of Information must be obtained prior to any record release in order to collaborate their care with an outside provider. Patient/Guardian was advised if they have not already done so to contact the registration department to sign all necessary forms in order for [place] to release information regarding their care.   Consent: Patient/Guardian gives verbal consent for treatment and assignment of benefits for services provided during this visit. Patient/Guardian expressed understanding and agreed to proceed.    I provided 28 minutes of non-face-to-face time during this encounter.   Kathlee Nations, MD

## 2021-09-08 ENCOUNTER — Other Ambulatory Visit (HOSPITAL_COMMUNITY): Payer: Self-pay | Admitting: *Deleted

## 2021-09-08 DIAGNOSIS — F411 Generalized anxiety disorder: Secondary | ICD-10-CM

## 2021-09-08 DIAGNOSIS — F319 Bipolar disorder, unspecified: Secondary | ICD-10-CM

## 2021-09-08 MED ORDER — LAMOTRIGINE 150 MG PO TABS: 150.0000 mg | ORAL_TABLET | Freq: Every day | ORAL | 1 refills | Status: DC

## 2021-09-09 DIAGNOSIS — J329 Chronic sinusitis, unspecified: Secondary | ICD-10-CM | POA: Diagnosis not present

## 2021-09-15 DIAGNOSIS — Z85828 Personal history of other malignant neoplasm of skin: Secondary | ICD-10-CM | POA: Diagnosis not present

## 2021-09-15 DIAGNOSIS — L578 Other skin changes due to chronic exposure to nonionizing radiation: Secondary | ICD-10-CM | POA: Diagnosis not present

## 2021-09-15 DIAGNOSIS — L603 Nail dystrophy: Secondary | ICD-10-CM | POA: Diagnosis not present

## 2021-09-23 DIAGNOSIS — J309 Allergic rhinitis, unspecified: Secondary | ICD-10-CM | POA: Diagnosis not present

## 2021-10-07 ENCOUNTER — Ambulatory Visit
Admission: RE | Admit: 2021-10-07 | Discharge: 2021-10-07 | Disposition: A | Discharge status: Home / Self Care | Payer: Medicare Other | Source: Ambulatory Visit | Attending: Internal Medicine | Admitting: Internal Medicine

## 2021-10-07 ENCOUNTER — Ambulatory Visit: Payer: Medicare Other

## 2021-10-07 DIAGNOSIS — Z1231 Encounter for screening mammogram for malignant neoplasm of breast: Secondary | ICD-10-CM

## 2021-10-21 ENCOUNTER — Encounter (HOSPITAL_COMMUNITY): Payer: Self-pay | Admitting: Psychiatry

## 2021-10-21 ENCOUNTER — Telehealth (HOSPITAL_BASED_OUTPATIENT_CLINIC_OR_DEPARTMENT_OTHER): Payer: Medicare Other | Admitting: Psychiatry

## 2021-10-21 DIAGNOSIS — F319 Bipolar disorder, unspecified: Secondary | ICD-10-CM | POA: Diagnosis not present

## 2021-10-21 DIAGNOSIS — F411 Generalized anxiety disorder: Secondary | ICD-10-CM

## 2021-10-21 MED ORDER — VENLAFAXINE HCL 100 MG PO TABS: 100.0000 mg | ORAL_TABLET | Freq: Two times a day (BID) | ORAL | 0 refills | Status: DC

## 2021-10-21 MED ORDER — QUETIAPINE FUMARATE 50 MG PO TABS: 50.0000 mg | ORAL_TABLET | Freq: Every day | ORAL | 0 refills | Status: DC

## 2021-10-21 MED ORDER — LORAZEPAM 0.5 MG PO TABS
ORAL_TABLET | ORAL | 1 refills | Status: DC
Start: 2021-10-21 — End: 2022-04-18

## 2021-10-21 MED ORDER — LAMOTRIGINE 150 MG PO TABS: 150.0000 mg | ORAL_TABLET | Freq: Every day | ORAL | 2 refills | Status: DC

## 2021-10-21 NOTE — Progress Notes (Signed)
Virtual Visit via Telephone Note  I connected with Sandra Cox on 10/21/21 at 10:40 AM EDT by telephone and verified that I am speaking with the correct person using two identifiers.  Location: Patient: Home Provider: Home Office   I discussed the limitations, risks, security and privacy concerns of performing an evaluation and management service by telephone and the availability of in person appointments. I also discussed with the patient that there may be a patient responsible charge related to this service. The patient expressed understanding and agreed to proceed.   History of Present Illness: Patient is evaluated by phone session.  She is taking her medication as prescribed.  She is taking Ativan 0.25 mg 2 times a day because helping her anxiety.  She also compliant with Seroquel, venlafaxine and Lamictal.  She denies any mania, impulsive behavior, crying spells or any feeling of hopelessness.  She sleeps good.  She had 1 panic attack when her husband was visiting with his girlfriend and she just passed out.  She is not sure what triggered her but since then she is doing fine.  Her husband does come to visit her with his girlfriend.  She had a good social network.  She continues to play chess and she was surprised that one of the friends who taught her chess to play beat her.  Patient denies any feeling of hopelessness or worthlessness.  Her energy level is good.  She wants to keep the current medication.  She lives at [facility].  Patient has contact with her daughter who lives in [place] however not much have communication with the daughter who lives in [place].  Patient told her daughter who lives in [place] is gay and does not communicate frequently.  Patient denies drinking or using any illegal substances.  Past Psychiatric History: Reviewed H/O depression and mania. Saw psychiatrist in [place], [place] and [place]. Saw Lattie Haw Pullis at [facility] and  given Seroquel, Anette Guarneri, Zoloft that did not work and lithium that cause tremors.  D/C lithium and Latuda to helped tremors.  No h/o inpatient or suicidal attempt.      Psychiatric Specialty Exam: Physical Exam  Review of Systems  Weight 123 lb (55.8 kg).There is no height or weight on file to calculate BMI.  General Appearance: NA  Eye Contact:  NA  Speech:  Clear and Coherent and fast  Volume:  Normal  Mood:  Anxious  Affect:  NA  Thought Process:  Goal Directed  Orientation:  Full (Time, Place, and Person)  Thought Content:  WDL  Suicidal Thoughts:  No  Homicidal Thoughts:  No  Memory:  Immediate;   Good Recent;   Good Remote;   Good  Judgement:  Intact  Insight:  Present  Psychomotor Activity:  NA  Concentration:  Concentration: Fair and Attention Span: Fair  Recall:  Good  Fund of Knowledge:  Good  Language:  Good  Akathisia:  No  Handed:  Right  AIMS (if indicated):     Assets:  Communication Skills Desire for Improvement Housing Resilience Social Support  ADL's:  Intact  Cognition:  WNL  Sleep:   good      Assessment and Plan: Bipolar disorder type I.  Generalized anxiety disorder.  Patient is stable on her current medication.  Continue Ativan 0.25 mg 2 times a day, Seroquel 50 mg at bedtime, Lamictal 150 mg daily and venlafaxine 100 mg 2 times a day.  Recommended to call us back if she has any question or  any concern.  Follow-up in 3 months.  Follow Up Instructions:    I discussed the assessment and treatment plan with the patient. The patient was provided an opportunity to ask questions and all were answered. The patient agreed with the plan and demonstrated an understanding of the instructions.   The patient was advised to call back or seek an in-person evaluation if the symptoms worsen or if the condition fails to improve as anticipated.  Collaboration of Care: Primary Care Provider AEB notes are available in epic to review  Patient/Guardian was  advised Release of Information must be obtained prior to any record release in order to collaborate their care with an outside provider. Patient/Guardian was advised if they have not already done so to contact the registration department to sign all necessary forms in order for [place] to release information regarding their care.   Consent: Patient/Guardian gives verbal consent for treatment and assignment of benefits for services provided during this visit. Patient/Guardian expressed understanding and agreed to proceed.    I provided 17 minutes of non-face-to-face time during this encounter.   Kathlee Nations, MD

## 2021-11-12 DIAGNOSIS — Z23 Encounter for immunization: Secondary | ICD-10-CM | POA: Diagnosis not present

## 2021-11-25 DIAGNOSIS — L578 Other skin changes due to chronic exposure to nonionizing radiation: Secondary | ICD-10-CM | POA: Diagnosis not present

## 2021-11-25 DIAGNOSIS — D1801 Hemangioma of skin and subcutaneous tissue: Secondary | ICD-10-CM | POA: Diagnosis not present

## 2021-11-25 DIAGNOSIS — L570 Actinic keratosis: Secondary | ICD-10-CM | POA: Diagnosis not present

## 2021-11-25 DIAGNOSIS — L821 Other seborrheic keratosis: Secondary | ICD-10-CM | POA: Diagnosis not present

## 2021-11-25 DIAGNOSIS — Z85828 Personal history of other malignant neoplasm of skin: Secondary | ICD-10-CM | POA: Diagnosis not present

## 2021-11-25 DIAGNOSIS — D225 Melanocytic nevi of trunk: Secondary | ICD-10-CM | POA: Diagnosis not present

## 2021-12-25 IMAGING — MG MM DIGITAL SCREENING BILAT W/ TOMO AND CAD
8 series · 9 of 24 positions shown · non-contrast
Comparison: Previous exam(s).

CLINICAL DATA: Screening.

EXAM:
DIGITAL SCREENING BILATERAL MAMMOGRAM WITH TOMOSYNTHESIS AND CAD
TECHNIQUE: Bilateral screening digital craniocaudal and mediolateral oblique
mammograms were obtained. Bilateral screening digital breast
tomosynthesis was performed. The images were evaluated with
computer-aided detection.

[L CC synth-2D]
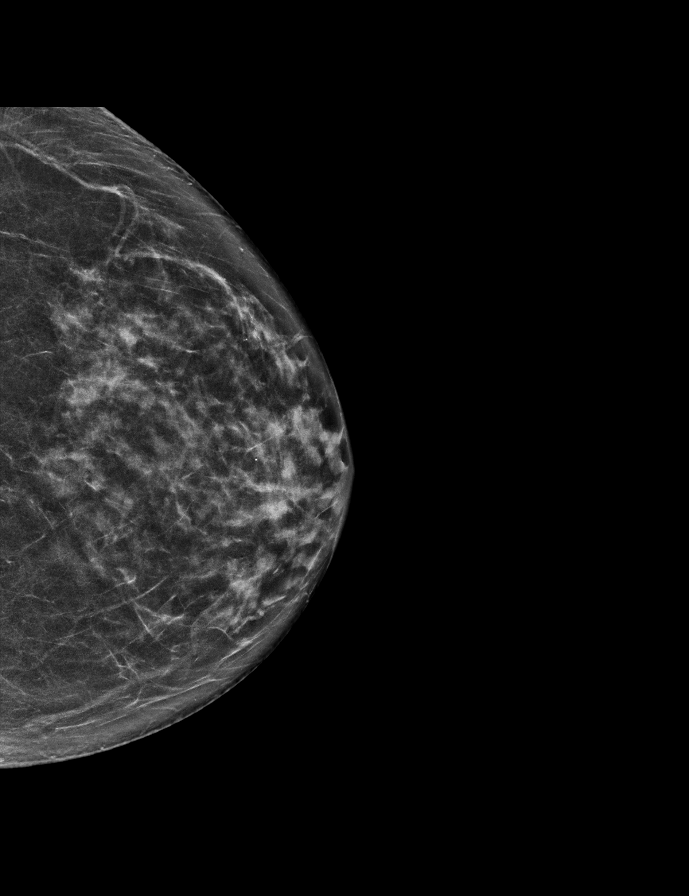

[R CC synth-2D]
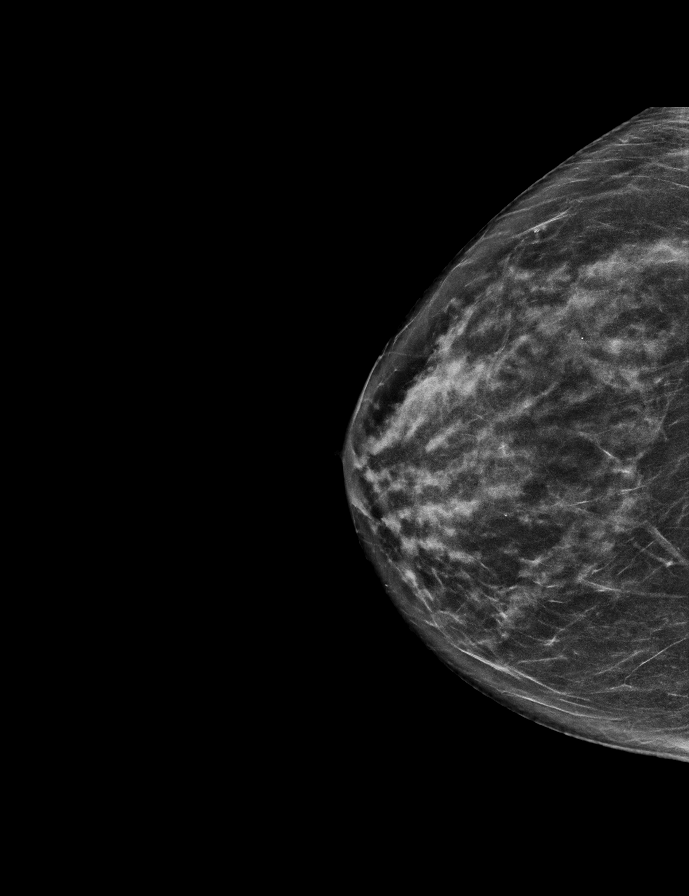

[L MLO synth-2D]
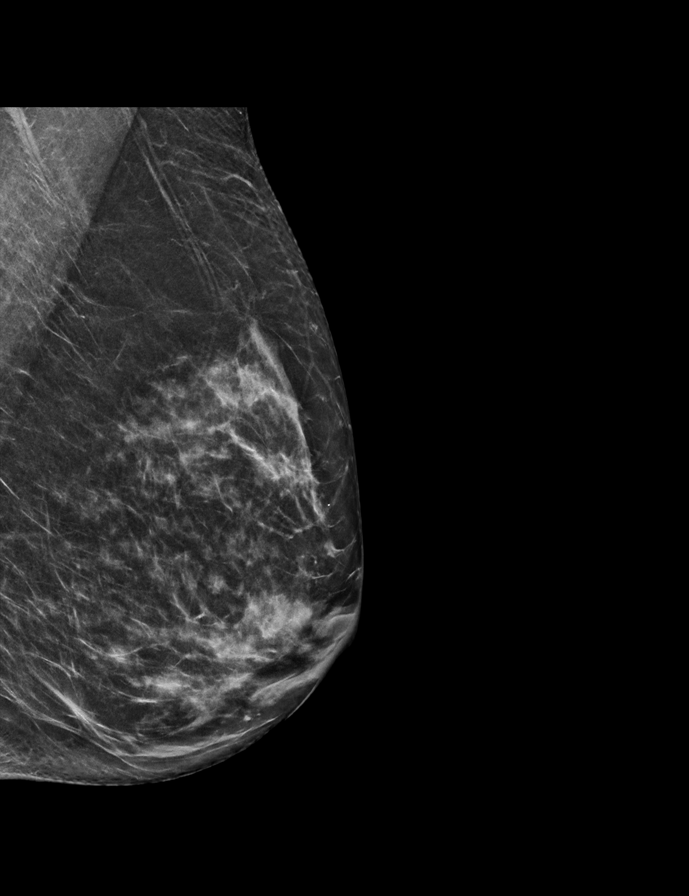

[R MLO synth-2D]
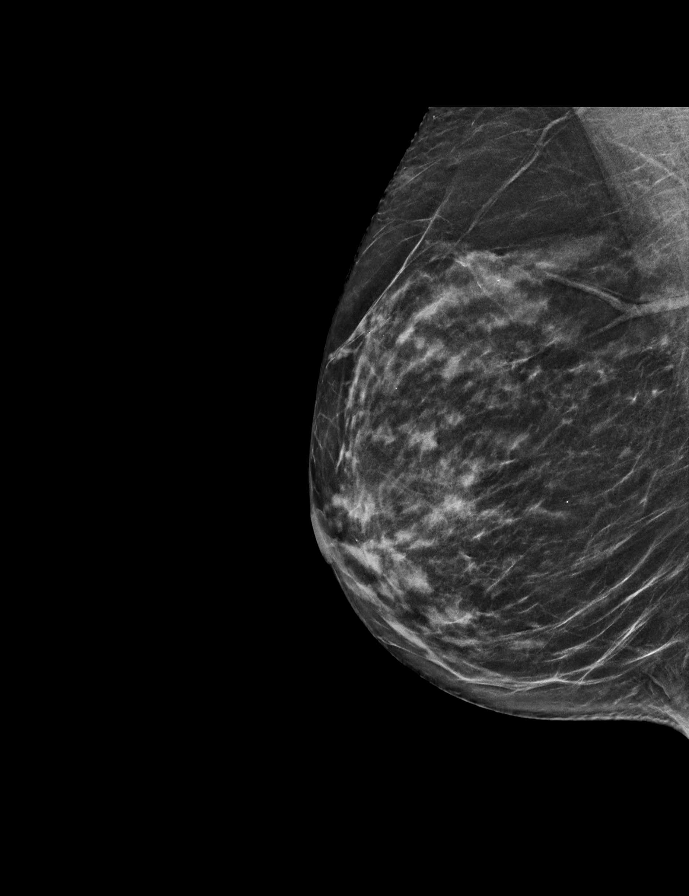

[L MLO tomo · 2 of 57 frames shown]
[frame 19/57]
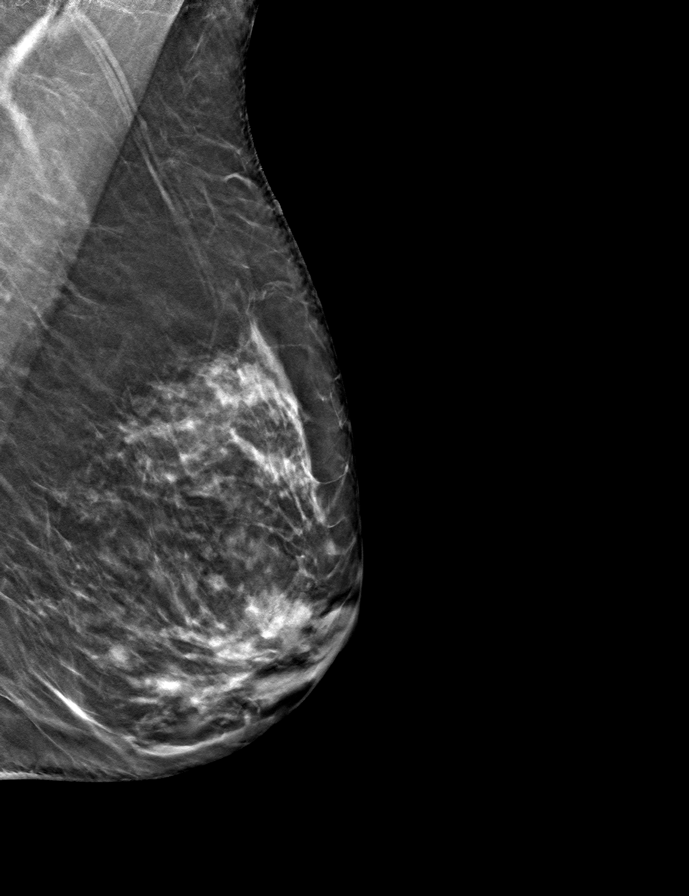
[frame 29/57]
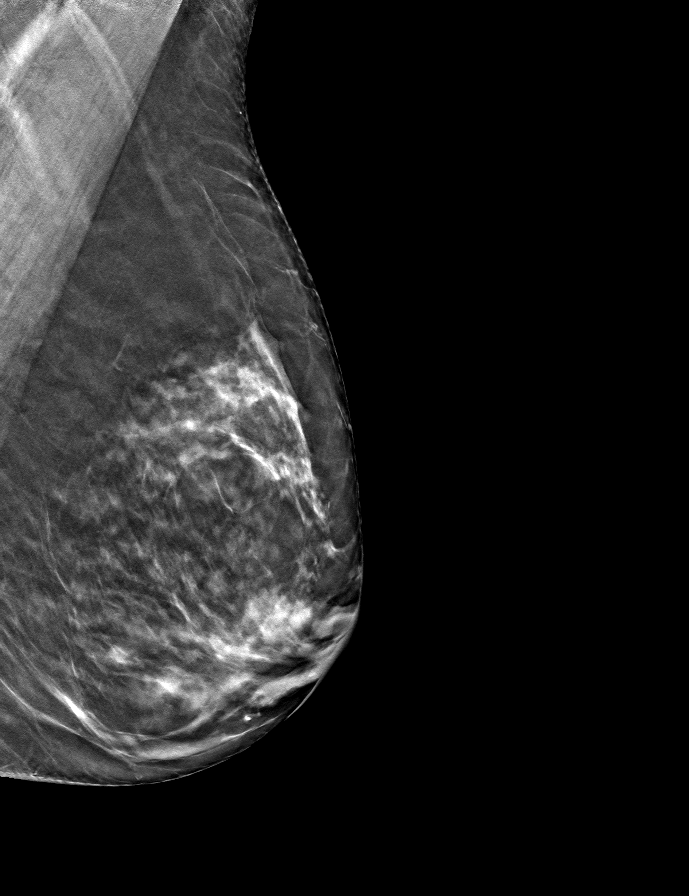

[R CC tomo · tomo slice 29/57.0]
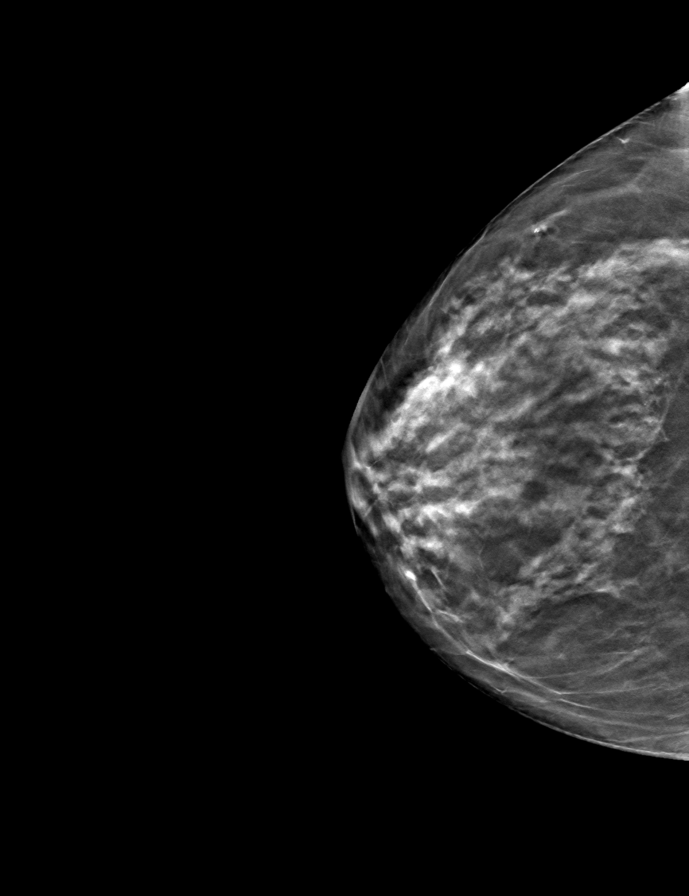

[R MLO tomo · tomo slice 27/54.0]
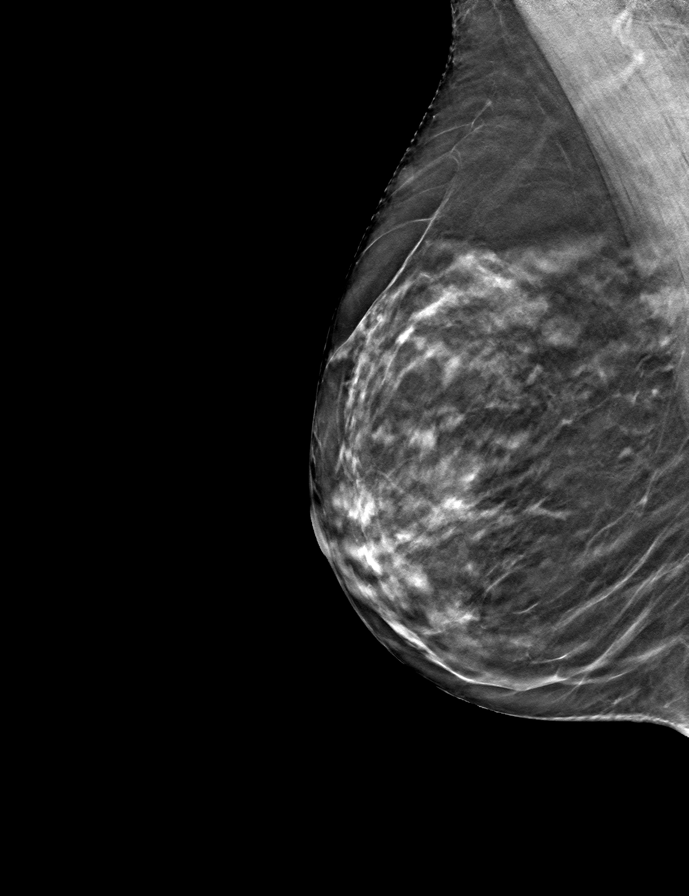

[L CC tomo · tomo slice 29/58.0]
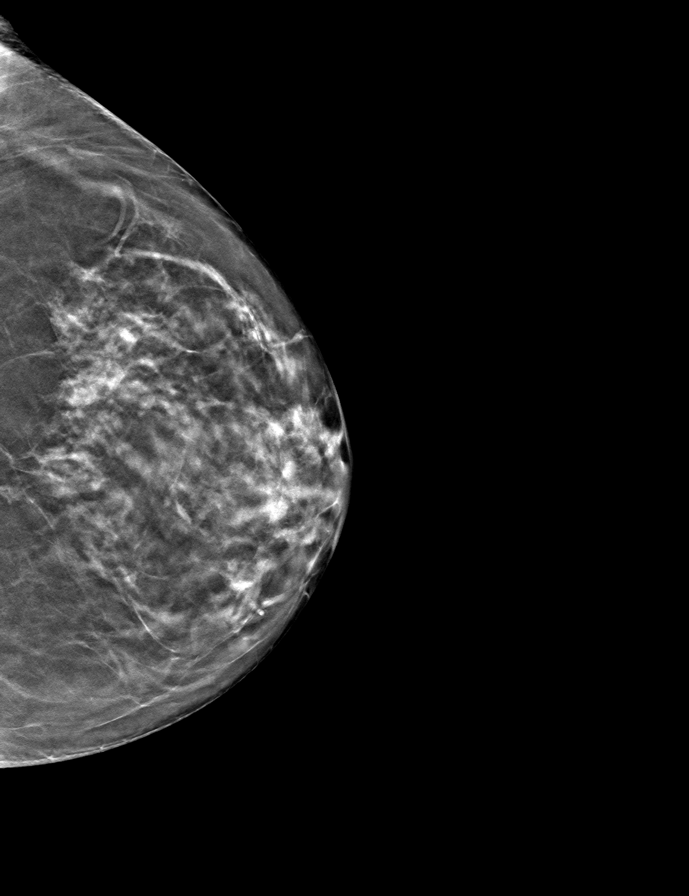

[9 of 24 positions shown; findings below may reference images not displayed]

ACR Breast Density Category c: The breast tissue is heterogeneously
dense, which may obscure small masses.
FINDINGS: There are no findings suspicious for malignancy.
IMPRESSION: No mammographic evidence of malignancy. A result letter of this
screening mammogram will be mailed directly to the patient.

RECOMMENDATION:
Screening mammogram in one year. (Code:Q3-W-BC3)

BI-RADS CATEGORY  1: Negative.

## 2022-01-03 ENCOUNTER — Other Ambulatory Visit (HOSPITAL_COMMUNITY): Payer: Self-pay | Admitting: Psychiatry

## 2022-01-03 DIAGNOSIS — F319 Bipolar disorder, unspecified: Secondary | ICD-10-CM

## 2022-01-03 DIAGNOSIS — F411 Generalized anxiety disorder: Secondary | ICD-10-CM

## 2022-01-19 ENCOUNTER — Telehealth (HOSPITAL_BASED_OUTPATIENT_CLINIC_OR_DEPARTMENT_OTHER): Payer: Medicare Other | Admitting: Psychiatry

## 2022-01-19 ENCOUNTER — Encounter (HOSPITAL_COMMUNITY): Payer: Self-pay | Admitting: Psychiatry

## 2022-01-19 DIAGNOSIS — F319 Bipolar disorder, unspecified: Secondary | ICD-10-CM | POA: Diagnosis not present

## 2022-01-19 DIAGNOSIS — F411 Generalized anxiety disorder: Secondary | ICD-10-CM

## 2022-01-19 MED ORDER — QUETIAPINE FUMARATE 50 MG PO TABS: 50.0000 mg | ORAL_TABLET | Freq: Every day | ORAL | 0 refills | Status: DC

## 2022-01-19 MED ORDER — VENLAFAXINE HCL 100 MG PO TABS: 100.0000 mg | ORAL_TABLET | Freq: Two times a day (BID) | ORAL | 0 refills | Status: DC

## 2022-01-19 MED ORDER — LAMOTRIGINE 150 MG PO TABS: 150.0000 mg | ORAL_TABLET | Freq: Every day | ORAL | 2 refills | Status: DC

## 2022-01-19 NOTE — Progress Notes (Signed)
Virtual Visit via Telephone Note  I connected with Sandra Cox on 01/19/22 at 11:20 AM EST by telephone and verified that I am speaking with the correct person using two identifiers.  Location: Patient: Home Provider: Home Office   I discussed the limitations, risks, security and privacy concerns of performing an evaluation and management service by telephone and the availability of in person appointments. I also discussed with the patient that there may be a patient responsible charge related to this service. The patient expressed understanding and agreed to proceed.   History of Present Illness: Patient is evaluated by phone session.  She is doing well on her medication.  She cut down her Ativan and only taking 0.25 mg at bedtime.  She has a refill remaining of Ativan.  She feels things are going very well.  She is very happy as her daughter from [place] came on Thanksgiving.  She sleeps good.  She denies any crying spells or any feeling of hopelessness or worthlessness.  She enjoyed living at a retirement place.  Her husband still comes at least once a month to visit and she enjoyed his visits.  Patient has no tremors, shakes or any EPS.  Her other daughter lives in [place] however does not communicate as frequently.  Patient reported her appetite is okay.  Her weight is stable.  Recently she had a visit with her PCP Dr. Louis Matte and her cholesterol medicines were changed.  Patient told her labs are normal.  Patient denies drinking or using any illegal substances.  Patient denies any mania, psychosis, hallucination.  She denies any impulsive behavior.  She reported her anxiety is well-controlled on current medication.  She is compliant with venlafaxine, Seroquel and Lamictal.  She has no rash.  Past Psychiatric History: Reviewed H/O depression and mania. Saw psychiatrist in [place], [place] and [place]. Saw Lattie Haw Pullis at [facility] and given Seroquel, Anette Guarneri, Zoloft that did not  work and lithium that cause tremors.  D/C lithium and Latuda to helped tremors.  No h/o inpatient or suicidal attempt.      Psychiatric Specialty Exam: Physical Exam  Review of Systems  Weight 125 lb (56.7 kg).There is no height or weight on file to calculate BMI.  General Appearance: NA  Eye Contact:  NA  Speech:  Clear and Coherent and fast  Volume:  Normal  Mood:  Anxious  Affect:  NA  Thought Process:  Goal Directed  Orientation:  Full (Time, Place, and Person)  Thought Content:  WDL  Suicidal Thoughts:  No  Homicidal Thoughts:  No  Memory:  Immediate;   Good Recent;   Good Remote;   Fair  Judgement:  Intact  Insight:  Present  Psychomotor Activity:  NA  Concentration:  Concentration: Fair and Attention Span: Fair  Recall:  Good  Fund of Knowledge:  Good  Language:  Good  Akathisia:  No  Handed:  Right  AIMS (if indicated):     Assets:  Communication Skills Desire for Improvement Housing Resilience Transportation  ADL's:  Intact  Cognition:  WNL  Sleep:         Assessment and Plan: Bipolar disorder type I.  Generalized anxiety disorder.  Patient doing better on her current medication.  She cut down her Ativan and only taking 0.25 mg at bedtime.  She still have refills remaining and I recommend to call us back if she need the refill in the future.  Continue Seroquel 50 mg at bedtime, Lamictal 150 mg daily and  venlafaxine 100 mg 2 times a day.  Recommended to call us back if she is any question of any concern.  Follow-up in 3 months.  Follow Up Instructions:    I discussed the assessment and treatment plan with the patient. The patient was provided an opportunity to ask questions and all were answered. The patient agreed with the plan and demonstrated an understanding of the instructions.   The patient was advised to call back or seek an in-person evaluation if the symptoms worsen or if the condition fails to improve as anticipated.  Collaboration of Care:  Other provider involved in patient's care AEB notes are available in epic to review.  Patient/Guardian was advised Release of Information must be obtained prior to any record release in order to collaborate their care with an outside provider. Patient/Guardian was advised if they have not already done so to contact the registration department to sign all necessary forms in order for [place] to release information regarding their care.   Consent: Patient/Guardian gives verbal consent for treatment and assignment of benefits for services provided during this visit. Patient/Guardian expressed understanding and agreed to proceed.    I provided 19 minutes of non-face-to-face time during this encounter.   Kathlee Nations, MD

## 2022-04-04 DIAGNOSIS — Z961 Presence of intraocular lens: Secondary | ICD-10-CM | POA: Diagnosis not present

## 2022-04-18 ENCOUNTER — Other Ambulatory Visit (HOSPITAL_COMMUNITY): Payer: Self-pay | Admitting: *Deleted

## 2022-04-18 ENCOUNTER — Other Ambulatory Visit (HOSPITAL_COMMUNITY): Payer: Self-pay | Admitting: Psychiatry

## 2022-04-18 DIAGNOSIS — F411 Generalized anxiety disorder: Secondary | ICD-10-CM

## 2022-04-18 MED ORDER — LORAZEPAM 0.5 MG PO TABS: ORAL_TABLET | ORAL | 0 refills | Status: DC

## 2022-04-19 ENCOUNTER — Telehealth (HOSPITAL_COMMUNITY): Payer: Self-pay | Admitting: *Deleted

## 2022-04-19 NOTE — Telephone Encounter (Signed)
Patient called requesting refill with urgency & noted that she has a appt scheduled  04-20-22  Last appt 12-13/23  [facility] [id number] - [place], [place] - [address]    lamoTRIgine (LAMICTAL) 150 MG tablet                          && LORazepam (ATIVAN) 0.5 MG tablet

## 2022-04-20 ENCOUNTER — Encounter (HOSPITAL_COMMUNITY): Payer: Self-pay | Admitting: Psychiatry

## 2022-04-20 ENCOUNTER — Telehealth (HOSPITAL_COMMUNITY): Payer: Medicare Other | Admitting: Psychiatry

## 2022-04-20 DIAGNOSIS — F319 Bipolar disorder, unspecified: Secondary | ICD-10-CM | POA: Diagnosis not present

## 2022-04-20 DIAGNOSIS — F411 Generalized anxiety disorder: Secondary | ICD-10-CM | POA: Diagnosis not present

## 2022-04-20 MED ORDER — VENLAFAXINE HCL 100 MG PO TABS: 100.0000 mg | ORAL_TABLET | Freq: Two times a day (BID) | ORAL | 0 refills | Status: DC

## 2022-04-20 MED ORDER — LAMOTRIGINE 150 MG PO TABS: 150.0000 mg | ORAL_TABLET | Freq: Every day | ORAL | 2 refills | Status: DC

## 2022-04-20 MED ORDER — LORAZEPAM 0.5 MG PO TABS: ORAL_TABLET | ORAL | 2 refills | Status: DC

## 2022-04-20 NOTE — Progress Notes (Signed)
[facility] MD Virtual Progress Note   Patient Location: Home Provider Location: Home Office  I connect with patient by video and verified that I am speaking with correct person by using two identifiers. I discussed the limitations of evaluation and management by telemedicine and the availability of in person appointments. I also discussed with the patient that there may be a patient responsible charge related to this service. The patient expressed understanding and agreed to proceed.  Fontaine Stavely [id number] 77 y.o.  04/20/2022 11:20 AM  History of Present Illness:  Patient is evaluated by video session.  She reported lately more anxious, nervous and having anxiety.  She has trouble sleeping and having racing thoughts.  She reported lately had a visit with the daughter but did not go very well.  Patient told her daughter who had previously married to the head of Sufi leader in [place] 6 years ago and 2-1/2 years ago he died.  Now patient has concern about her because she has changed her religion and does not communicate with her.  Patient told she moved to [place] from [place] few years ago so she can live close to her but now she is not sure about her social network.  Patient told they have some disagreement and last visit was very difficult for her.  Patient reported some time feeling of cry but denies any suicidal thoughts.  She still had a good relationship with her husband who comes at least once a month to visit her.  She has good close 4 friends but she wants to keep her emotions to herself.  Since taking the Ativan to 0.25 mg 2 times a day she feeling more relaxed.  Even though she described lack of appetite but her weight is stable.  She feels sometimes tired and lack of motivation to do things but denies any feeling of hopelessness or worthlessness.  She has no tremor or shakes or any EPS.  She has no rash.  She is compliant with venlafaxine, Seroquel and  Lamictal.  Past Psychiatric History: H/O depression and mania. Saw psychiatrist in [place], [place] and [place]. Saw Lattie Haw Pullis at [facility] and given Seroquel, Anette Guarneri, Zoloft that did not work and lithium that cause tremors.  D/C lithium and Latuda to helped tremors.  No h/o inpatient or suicidal attempt.       Outpatient Encounter Medications as of 04/20/2022  Medication Sig   clobetasol (OLUX) 0.05 % topical foam    lamoTRIgine (LAMICTAL) 150 MG tablet Take 1 tablet (150 mg total) by mouth daily.   LORazepam (ATIVAN) 0.5 MG tablet Take half to one tab twice a day as needed   QUEtiapine (SEROQUEL) 50 MG tablet Take 1 tablet (50 mg total) by mouth at bedtime.   rosuvastatin (CRESTOR) 10 MG tablet Take 10 mg by mouth daily.   venlafaxine (EFFEXOR) 100 MG tablet Take 1 tablet (100 mg total) by mouth 2 (two) times daily with a meal.   No facility-administered encounter medications on file as of 04/20/2022.    No results found for this or any previous visit (from the past 2160 hour(s)).   Psychiatric Specialty Exam: Physical Exam  Review of Systems  Weight 125 lb (56.7 kg).There is no height or weight on file to calculate BMI.  General Appearance: Casual  Eye Contact:  Good  Speech:  Clear and Coherent and fast  Volume:  Normal  Mood:  Anxious  Affect:  Congruent  Thought Process:  Goal Directed  Orientation:  Full (Time, Place, and Person)  Thought Content:  Rumination  Suicidal Thoughts:  No  Homicidal Thoughts:  No  Memory:  Immediate;   Good Recent;   Good Remote;   Good  Judgement:  Intact  Insight:  Present  Psychomotor Activity:  Increased  Concentration:  Concentration: Fair and Attention Span: Fair  Recall:  Good  Fund of Knowledge:  Good  Language:  Good  Akathisia:  No  Handed:  Right  AIMS (if indicated):     Assets:  Communication Skills Desire for Improvement Housing Resilience Transportation  ADL's:  Intact  Cognition:  WNL  Sleep:   fair     Assessment/Plan: GAD (generalized anxiety disorder) - Plan: venlafaxine (EFFEXOR) 100 MG tablet, LORazepam (ATIVAN) 0.5 MG tablet, lamoTRIgine (LAMICTAL) 150 MG tablet  Bipolar I disorder (HCC) - Plan: lamoTRIgine (LAMICTAL) 150 MG tablet  Discussed psychosocial stressors.  This is the first time patient mentioned about her daughter who in her college was gay and then she married to the leader of Sufi religion 6 years ago who died 2-1/2 years ago.  Patient is still following the religion and she believes she has a lot of following and people who follow the religion are now moving to [place] from [place] because she was the wife of the leader.  Patient has some concerns but so far she stable and like to keep the lorazepam 0.25 mg twice a day.  I encouraged should consider seeing a therapist if she feel worsening of symptoms patient agreed to give [place] a call if needed.  Discussed safety concerns and any time having active suicidal thoughts or homicidal halogen need to call 911 or go to local emergency room.  We will continue Lamictal 150 mg daily, Seroquel 50 mg at bedtime and venlafaxine 100 mg 2 times a day.  Follow-up in 3 months.   Follow Up Instructions:     I discussed the assessment and treatment plan with the patient. The patient was provided an opportunity to ask questions and all were answered. The patient agreed with the plan and demonstrated an understanding of the instructions.   The patient was advised to call back or seek an in-person evaluation if the symptoms worsen or if the condition fails to improve as anticipated.    Collaboration of Care: Other provider involved in patient's care AEB notes are available in epic to review.  Patient/Guardian was advised Release of Information must be obtained prior to any record release in order to collaborate their care with an outside provider. Patient/Guardian was advised if they have not already done so to contact the  registration department to sign all necessary forms in order for [place] to release information regarding their care.   Consent: Patient/Guardian gives verbal consent for treatment and assignment of benefits for services provided during this visit. Patient/Guardian expressed understanding and agreed to proceed.     I provided 29 minutes of non face to face time during this encounter.  Kathlee Nations, MD 04/20/2022

## 2022-04-28 ENCOUNTER — Other Ambulatory Visit (HOSPITAL_COMMUNITY): Payer: Self-pay | Admitting: Psychiatry

## 2022-04-28 DIAGNOSIS — F319 Bipolar disorder, unspecified: Secondary | ICD-10-CM

## 2022-05-04 ENCOUNTER — Telehealth (HOSPITAL_COMMUNITY): Payer: Self-pay | Admitting: *Deleted

## 2022-05-04 NOTE — Telephone Encounter (Signed)
Patient told me that she is taking 0.25 mg 2 times a day and prescription is 0.5.  If she is taking 0.25 mg then 30 tablets will be enough for 30 days.  Please clarify the dosage with the patient.

## 2022-05-04 NOTE — Telephone Encounter (Signed)
Pt has left two VM's regarding the Ativan 0.5 mg tabs. Pt says she's taking it BID now and script was only sent in for #30. SIG: Tale 1/2 to 1 tablet QD PRN. Pt last seen on 04/20/22. F/u scheduled for 07/20/22. Please review and advise.

## 2022-05-04 NOTE — Telephone Encounter (Signed)
Writer spoke with pt and advised that taking 1/2 tab BID #30 would cover 1 month. Pt initially did  not understand and asked for #45. Writer dosage and number of 0.5 mg tabs. Pt did verbalize understanding then.

## 2022-06-02 ENCOUNTER — Other Ambulatory Visit (HOSPITAL_COMMUNITY): Payer: Self-pay | Admitting: Psychiatry

## 2022-06-02 DIAGNOSIS — F319 Bipolar disorder, unspecified: Secondary | ICD-10-CM

## 2022-06-02 DIAGNOSIS — F411 Generalized anxiety disorder: Secondary | ICD-10-CM

## 2022-06-06 ENCOUNTER — Other Ambulatory Visit (HOSPITAL_COMMUNITY): Payer: Self-pay | Admitting: Psychiatry

## 2022-06-06 ENCOUNTER — Telehealth (HOSPITAL_COMMUNITY): Payer: Self-pay | Admitting: *Deleted

## 2022-06-06 DIAGNOSIS — F319 Bipolar disorder, unspecified: Secondary | ICD-10-CM

## 2022-06-06 NOTE — Telephone Encounter (Signed)
No new Ativan.  Should use the refills which was given earlier.  She is too soon to get new prescription.

## 2022-06-06 NOTE — Telephone Encounter (Signed)
Pt continues to call asking for refill of Lorazepam o.5 mg take a 1/2 to a whole tablet BID PRN. Last script e-scribed on 04/20/22 for #30 with 2 refills. Writer called pharmacy but had to leave a VM as pharmacy at lunch. Script sent on 04/20/22 has a receipt confirmation on 04/20/22 @ 1135. Writer asked pharmacy to check again and call pt when the medication is filled. Left my direct line should they need to contact me.

## 2022-06-06 NOTE — Telephone Encounter (Signed)
Will advise

## 2022-06-15 DIAGNOSIS — N1831 Chronic kidney disease, stage 3a: Secondary | ICD-10-CM | POA: Diagnosis not present

## 2022-06-15 DIAGNOSIS — M8589 Other specified disorders of bone density and structure, multiple sites: Secondary | ICD-10-CM | POA: Diagnosis not present

## 2022-06-21 ENCOUNTER — Other Ambulatory Visit: Payer: Self-pay | Admitting: Internal Medicine

## 2022-06-21 DIAGNOSIS — Z1231 Encounter for screening mammogram for malignant neoplasm of breast: Secondary | ICD-10-CM

## 2022-07-19 DIAGNOSIS — Z5181 Encounter for therapeutic drug level monitoring: Secondary | ICD-10-CM | POA: Diagnosis not present

## 2022-07-19 DIAGNOSIS — G250 Essential tremor: Secondary | ICD-10-CM | POA: Diagnosis not present

## 2022-07-19 DIAGNOSIS — M8589 Other specified disorders of bone density and structure, multiple sites: Secondary | ICD-10-CM | POA: Diagnosis not present

## 2022-07-19 DIAGNOSIS — F319 Bipolar disorder, unspecified: Secondary | ICD-10-CM | POA: Diagnosis not present

## 2022-07-19 DIAGNOSIS — N1831 Chronic kidney disease, stage 3a: Secondary | ICD-10-CM | POA: Diagnosis not present

## 2022-07-19 DIAGNOSIS — Z0000 Encounter for general adult medical examination without abnormal findings: Secondary | ICD-10-CM | POA: Diagnosis not present

## 2022-07-19 DIAGNOSIS — Z1331 Encounter for screening for depression: Secondary | ICD-10-CM | POA: Diagnosis not present

## 2022-07-19 DIAGNOSIS — E7800 Pure hypercholesterolemia, unspecified: Secondary | ICD-10-CM | POA: Diagnosis not present

## 2022-07-20 ENCOUNTER — Telehealth (HOSPITAL_COMMUNITY): Payer: Medicare Other | Admitting: Psychiatry

## 2022-07-20 ENCOUNTER — Encounter (HOSPITAL_COMMUNITY): Payer: Self-pay | Admitting: Psychiatry

## 2022-07-20 VITALS — Wt 125.0 lb

## 2022-07-20 DIAGNOSIS — F319 Bipolar disorder, unspecified: Secondary | ICD-10-CM

## 2022-07-20 DIAGNOSIS — F411 Generalized anxiety disorder: Secondary | ICD-10-CM

## 2022-07-20 MED ORDER — LAMOTRIGINE 150 MG PO TABS
150.0000 mg | ORAL_TABLET | Freq: Every day | ORAL | 2 refills | Status: DC
Start: 2022-07-20 — End: 2022-10-20

## 2022-07-20 MED ORDER — VENLAFAXINE HCL 100 MG PO TABS
100.0000 mg | ORAL_TABLET | Freq: Two times a day (BID) | ORAL | 0 refills | Status: DC
Start: 2022-07-20 — End: 2022-10-20

## 2022-07-20 MED ORDER — LORAZEPAM 0.5 MG PO TABS
ORAL_TABLET | ORAL | 2 refills | Status: DC
Start: 2022-07-20 — End: 2022-10-20

## 2022-07-20 MED ORDER — QUETIAPINE FUMARATE 50 MG PO TABS
50.0000 mg | ORAL_TABLET | Freq: Every day | ORAL | 0 refills | Status: DC
Start: 2022-07-20 — End: 2022-10-20

## 2022-07-20 NOTE — Progress Notes (Signed)
[facility] MD Virtual Progress Note   Patient Location: Home Provider Location: Home Office  I connect with patient by telephone and verified that I am speaking with correct person by using two identifiers. I discussed the limitations of evaluation and management by telemedicine and the availability of in person appointments. I also discussed with the patient that there may be a patient responsible charge related to this service. The patient expressed understanding and agreed to proceed.  Sandra Cox [id number] 77 y.o.  07/20/2022 2:46 PM  History of Present Illness:  Patient is evaluated by phone session.  She told that cut down her Ativan and only taking half tablet at bedtime.  She appears very anxious, nervous with fast and pressured speech.  She gets confused sometimes when I ask about Ativan refills.  She had called few times our office requesting refills even though she has refill remaining.  She reported had a better relationship with her daughter who lives in [place].  There are few times daughter came to visit her.  She reported her sleep is erratic but most of the night she able to sleep.  She denies any mania, psychosis, crying spells or any feeling of hopelessness or worthlessness.  She reported there are some disagreement with the daughter but no new issues.  Her appetite is fair.  Her energy level is high.  She denies drinking or using any illegal substances.  She is taking Seroquel, Lamictal, venlafaxine and lorazepam.  She has no rash, itching tremors or shakes.  Recently she had a blood work at her primary care physician but results are pending.    Past Psychiatric History: H/O depression and mania. Saw psychiatrist in [place], [place] and [place]. Saw Misty Stanley Pullis at [facility] and given Seroquel, Kasandra Knudsen, Zoloft that did not work and lithium that cause tremors.  D/C lithium and Latuda to helped tremors.  No h/o inpatient or suicidal attempt.        Outpatient Encounter Medications as of 07/20/2022  Medication Sig   clobetasol (OLUX) 0.05 % topical foam    lamoTRIgine (LAMICTAL) 150 MG tablet Take 1 tablet (150 mg total) by mouth daily.   LORazepam (ATIVAN) 0.5 MG tablet Take half to one tab twice a day as needed   QUEtiapine (SEROQUEL) 50 MG tablet Take 1 tablet (50 mg total) by mouth at bedtime.   rosuvastatin (CRESTOR) 10 MG tablet Take 10 mg by mouth daily.   venlafaxine (EFFEXOR) 100 MG tablet Take 1 tablet (100 mg total) by mouth 2 (two) times daily with a meal.   No facility-administered encounter medications on file as of 07/20/2022.    No results found for this or any previous visit (from the past 2160 hour(s)).   Psychiatric Specialty Exam: Physical Exam  Review of Systems  Psychiatric/Behavioral:  Positive for decreased concentration. The patient is nervous/anxious.     Weight 125 lb (56.7 kg).There is no height or weight on file to calculate BMI.  General Appearance: NA  Eye Contact:  NA  Speech:  Pressured  Volume:  Increased  Mood:  Anxious  Affect:  NA  Thought Process:  Descriptions of Associations: Intact  Orientation:  Full (Time, Place, and Person)  Thought Content:  Rumination  Suicidal Thoughts:  No  Homicidal Thoughts:  No  Memory:  Immediate;   Fair Recent;   Fair Remote;   Fair  Judgement:  Fair  Insight:  Present  Psychomotor Activity:  NA  Concentration:  Concentration: Fair and Attention Span:  Fair  Recall:  [employer] of Knowledge:  Fair  Language:  Good  Akathisia:  No  Handed:  Right  AIMS (if indicated):     Assets:  Communication Skills Desire for Improvement Transportation  ADL's:  Intact  Cognition:  WNL  Sleep:  fair     Assessment/Plan: Bipolar I disorder (HCC) - Plan: lamoTRIgine (LAMICTAL) 150 MG tablet, QUEtiapine (SEROQUEL) 50 MG tablet  GAD (generalized anxiety disorder) - Plan: lamoTRIgine (LAMICTAL) 150 MG tablet, LORazepam (ATIVAN) 0.5 MG tablet, venlafaxine  (EFFEXOR) 100 MG tablet  Discussed psychosocial stressors.  Patient appears to have some time confusion with increased anxiety.  I encourage to have next appointment in person.  She is not sure about her weight and also sometimes she gets confused about her medication refills.  She is taking lorazepam 0.25 mg only at bedtime.  I explained that she should have enough refills but patient keep asking that she is running low on the medication.  I asked should bring her medication list next time on her appointment.  She also had a blood work done yesterday and we will contact her PCP to get blood work results.  She does not want to change the medication.  Continue Lamictal 150 mg daily, Seroquel 50 mg at bedtime, venlafaxine 100 mg 2 times a day and lorazepam 0.25 mg at bedtime.  I did send all the refills.  We will follow-up in 3 months in person.  Follow Up Instructions:     I discussed the assessment and treatment plan with the patient. The patient was provided an opportunity to ask questions and all were answered. The patient agreed with the plan and demonstrated an understanding of the instructions.   The patient was advised to call back or seek an in-person evaluation if the symptoms worsen or if the condition fails to improve as anticipated.    Collaboration of Care: Other provider involved in patient's care AEB notes are available in epic to review.  Patient/Guardian was advised Release of Information must be obtained prior to any record release in order to collaborate their care with an outside provider. Patient/Guardian was advised if they have not already done so to contact the registration department to sign all necessary forms in order for [place] to release information regarding their care.   Consent: Patient/Guardian gives verbal consent for treatment and assignment of benefits for services provided during this visit. Patient/Guardian expressed understanding and agreed to proceed.     I  provided 28 minutes of non face to face time during this encounter.  Note: This document was prepared by [employer] voice dictation technology and any errors that results from this process are unintentional.    Cleotis Nipper, MD 07/20/2022

## 2022-07-21 ENCOUNTER — Other Ambulatory Visit (HOSPITAL_COMMUNITY): Payer: Self-pay | Admitting: *Deleted

## 2022-07-21 DIAGNOSIS — Z79899 Other long term (current) drug therapy: Secondary | ICD-10-CM

## 2022-08-29 ENCOUNTER — Ambulatory Visit (HOSPITAL_COMMUNITY): Payer: TRICARE For Life (TFL)

## 2022-08-31 ENCOUNTER — Ambulatory Visit (HOSPITAL_COMMUNITY): Payer: TRICARE For Life (TFL)

## 2022-08-31 ENCOUNTER — Encounter (HOSPITAL_COMMUNITY): Payer: Self-pay

## 2022-08-31 ENCOUNTER — Other Ambulatory Visit (HOSPITAL_COMMUNITY): Payer: Self-pay | Admitting: Psychiatry

## 2022-08-31 VITALS — BP 126/68 | HR 70 | Ht 65.0 in | Wt 125.0 lb

## 2022-08-31 DIAGNOSIS — Z79899 Other long term (current) drug therapy: Secondary | ICD-10-CM

## 2022-08-31 NOTE — Progress Notes (Signed)
Patient came in for her ordered labs of CMP and A1c. Patient did not have any questions or concerns, blood was drawn from right [facility], patient tolerated well and without complaint.

## 2022-09-01 LAB — CMP14+EGFR
ALT: 13 IU/L (ref 0–32)
AST: 21 IU/L (ref 0–40)
Albumin: 4.1 g/dL (ref 3.8–4.8)
Alkaline Phosphatase: 84 IU/L (ref 44–121)
BUN/Creatinine Ratio: 14 (ref 12–28)
BUN: 17 mg/dL (ref 8–27)
Bilirubin Total: 0.3 mg/dL (ref 0.0–1.2)
CO2: 24 mmol/L (ref 20–29)
Calcium: 9.4 mg/dL (ref 8.7–10.3)
Chloride: 106 mmol/L (ref 96–106)
Creatinine, Ser: 1.22 mg/dL — ABNORMAL HIGH (ref 0.57–1.00)
Globulin, Total: 2.3 g/dL (ref 1.5–4.5)
Glucose: 116 mg/dL — ABNORMAL HIGH (ref 70–99)
Potassium: 4.1 mmol/L (ref 3.5–5.2)
Sodium: 142 mmol/L (ref 134–144)
Total Protein: 6.4 g/dL (ref 6.0–8.5)
eGFR: 45 mL/min/{1.73_m2} — ABNORMAL LOW (ref >59)

## 2022-09-01 LAB — HGB A1C W/O EAG: Hgb A1c MFr Bld: 5.8 % — ABNORMAL HIGH (ref 4.8–5.6)

## 2022-09-13 ENCOUNTER — Other Ambulatory Visit (HOSPITAL_COMMUNITY): Payer: Self-pay | Admitting: Psychiatry

## 2022-09-13 DIAGNOSIS — F411 Generalized anxiety disorder: Secondary | ICD-10-CM

## 2022-09-16 DIAGNOSIS — R221 Localized swelling, mass and lump, neck: Secondary | ICD-10-CM | POA: Diagnosis not present

## 2022-09-16 DIAGNOSIS — R1310 Dysphagia, unspecified: Secondary | ICD-10-CM | POA: Diagnosis not present

## 2022-09-23 DIAGNOSIS — R221 Localized swelling, mass and lump, neck: Secondary | ICD-10-CM | POA: Diagnosis not present

## 2022-09-23 DIAGNOSIS — E041 Nontoxic single thyroid nodule: Secondary | ICD-10-CM | POA: Diagnosis not present

## 2022-10-11 ENCOUNTER — Ambulatory Visit: Payer: Medicare Other

## 2022-10-19 ENCOUNTER — Ambulatory Visit
Admission: RE | Admit: 2022-10-19 | Discharge: 2022-10-19 | Disposition: A | Discharge status: Home / Self Care | Payer: Medicare Other | Source: Ambulatory Visit | Attending: Internal Medicine | Admitting: Internal Medicine

## 2022-10-19 DIAGNOSIS — Z1231 Encounter for screening mammogram for malignant neoplasm of breast: Secondary | ICD-10-CM

## 2022-10-20 ENCOUNTER — Ambulatory Visit (HOSPITAL_COMMUNITY): Payer: Medicare Other | Admitting: Psychiatry

## 2022-10-20 ENCOUNTER — Encounter (HOSPITAL_COMMUNITY): Payer: Self-pay | Admitting: Psychiatry

## 2022-10-20 VITALS — BP 143/90 | HR 90 | Ht 65.0 in | Wt 132.0 lb

## 2022-10-20 DIAGNOSIS — F411 Generalized anxiety disorder: Secondary | ICD-10-CM | POA: Diagnosis not present

## 2022-10-20 DIAGNOSIS — F319 Bipolar disorder, unspecified: Secondary | ICD-10-CM | POA: Diagnosis not present

## 2022-10-20 MED ORDER — VENLAFAXINE HCL 100 MG PO TABS
100.0000 mg | ORAL_TABLET | Freq: Two times a day (BID) | ORAL | 0 refills | Status: DC
Start: 2022-10-20 — End: 2023-01-26

## 2022-10-20 MED ORDER — LAMOTRIGINE 150 MG PO TABS
150.0000 mg | ORAL_TABLET | Freq: Every day | ORAL | 2 refills | Status: DC
Start: 2022-10-20 — End: 2023-01-26

## 2022-10-20 MED ORDER — LORAZEPAM 0.5 MG PO TABS
0.5000 mg | ORAL_TABLET | Freq: Every day | ORAL | 2 refills | Status: DC
Start: 2022-10-20 — End: 2023-01-26

## 2022-10-20 MED ORDER — QUETIAPINE FUMARATE 50 MG PO TABS
50.0000 mg | ORAL_TABLET | Freq: Every day | ORAL | 0 refills | Status: DC
Start: 2022-10-20 — End: 2023-01-26

## 2022-10-20 NOTE — Progress Notes (Signed)
[facility] MD/PA/NP OP Progress Note  10/20/2022 2:02 PM Sandra Cox  MRN:  [id number]  Chief Complaint:  Chief Complaint  Patient presents with   Anxiety   Follow-up   HPI: Patient came in for her follow-up appointment.  This is in person visit.  She appears nervous, anxious and at times her speech is pressured.  She admitted lately thinking about her past and not sleeping well.  She also reported sometime her emotions are very strong which she expressed with around people.  She reported her daughter came and visit from [place] to visit her.  She continues to engage in chest and she enjoyed playing chess.  She denies any crying spells or any feeling of hopelessness or worthlessness.  However she reported her anxiety is increased.  She denies any suicidal thoughts or any impulsive behavior regarding buying or shopping.  However she does feel her emotions are high because she has free time to herself.  She has no tremors, shakes, rash or any itching.  She is taking half tablet of lorazepam.  She did had a blood work and her hemoglobin A1c 5.8.  Her CMP shows creatinine 1.22 and glucose of 116.  She has no tremors.  She is taking Seroquel, Lamictal and venlafaxine.   Visit Diagnosis:    ICD-10-CM   1. GAD (generalized anxiety disorder)  F41.1     2. Bipolar I disorder (HCC)  F31.9       Past Psychiatric History: Reviewed H/O depression and mania. Saw psychiatrist in [place], [place] and [place]. Saw Misty Stanley Pullis at [facility] and given Seroquel, Kasandra Knudsen, Zoloft that did not work and lithium that cause tremors.  D/C lithium and Latuda to helped tremors.  No h/o inpatient or suicidal attempt.      Past Medical History:  Past Medical History:  Diagnosis Date   Bipolar disorder (HCC)     Past Surgical History:  Procedure Laterality Date   CATARACT EXTRACTION Right    FACELIFT, LOWER 2/3  2014    Family Psychiatric History: Reviewed  Family History:  Family History  Problem  Relation Age of Onset   Stroke Mother    Heart attack Father    Healthy Daughter    Breast cancer Neg Hx     Social History:  Social History   Socioeconomic History   Marital status: Married    Spouse name: Not on file   Number of children: 2   Years of education: Not on file   Highest education level: Bachelor's degree (e.g., BA, AB, BS)  Occupational History   Occupation: retired    Comment: Runner, broadcasting/film/video, 3rd grade x 7 years  Tobacco Use   Smoking status: Never   Smokeless tobacco: Never  Vaping Use   Vaping status: Never Used  Substance and Sexual Activity   Alcohol use: No   Drug use: No   Sexual activity: Not Currently  Other Topics Concern   Not on file  Social History Narrative   Not on file   Social Determinants of Health   Financial Resource Strain: Low Risk  (06/15/2017)   Overall Financial Resource Strain (CARDIA)    Difficulty of Paying Living Expenses: Not hard at all  Food Insecurity: No Food Insecurity (06/15/2017)   Hunger Vital Sign    Worried About Running Out of Food in the Last Year: Never true    Ran Out of Food in the Last Year: Never true  Transportation Needs: No Transportation Needs (06/15/2017)   PRAPARE -  Administrator, Civil Service (Medical): No    Lack of Transportation (Non-Medical): No  Physical Activity: Inactive (06/15/2017)   Exercise Vital Sign    Days of Exercise per Week: 0 days    Minutes of Exercise per Session: 0 min  Stress: Stress Concern Present (06/15/2017)   [employer] of Occupational Health - Occupational Stress Questionnaire    Feeling of Stress : To some extent  Social Connections: Unknown (04/27/2019)   Received from [facility], [facility]   Social Connections    Frequency of Communication with Friends and Family: Not asked    Frequency of Social Gatherings with Friends and Family: Not asked    Allergies: No Known Allergies  Metabolic Disorder Labs: Lab Results  Component Value Date   HGBA1C  5.8 (H) 08/31/2022   No results found for: "PROLACTIN" No results found for: "CHOL", "TRIG", "HDL", "CHOLHDL", "VLDL", "LDLCALC" Lab Results  Component Value Date   TSH 0.82 07/04/2017    Therapeutic Level Labs: No results found for: "LITHIUM" No results found for: "VALPROATE" No results found for: "CBMZ"  Current Medications: Current Outpatient Medications  Medication Sig Dispense Refill   clobetasol (OLUX) 0.05 % topical foam   3   lamoTRIgine (LAMICTAL) 150 MG tablet Take 1 tablet (150 mg total) by mouth daily. 30 tablet 2   LORazepam (ATIVAN) 0.5 MG tablet Take half to one tab twice a day as needed 30 tablet 2   QUEtiapine (SEROQUEL) 50 MG tablet Take 1 tablet (50 mg total) by mouth at bedtime. 90 tablet 0   rosuvastatin (CRESTOR) 10 MG tablet Take 10 mg by mouth daily.     venlafaxine (EFFEXOR) 100 MG tablet Take 1 tablet (100 mg total) by mouth 2 (two) times daily with a meal. 180 tablet 0   No current facility-administered medications for this visit.     Musculoskeletal: Strength & Muscle Tone: within normal limits Gait & Station: normal Patient leans: N/A  Psychiatric Specialty Exam: Review of Systems  Blood pressure (!) 143/90, pulse 90, height 5\' 5"  (1.651 m), weight 132 lb (59.9 kg).Body mass index is 21.97 kg/m.  General Appearance: Well Groomed  Eye Contact:  Fair  Speech:   fast  Volume:  Normal  Mood:  Anxious  Affect:  Labile  Thought Process:  Goal Directed  Orientation:  Full (Time, Place, and Person)  Thought Content: WDL   Suicidal Thoughts:  No  Homicidal Thoughts:  No  Memory:  Immediate;   Good Recent;   Fair Remote;   Fair  Judgement:  Intact  Insight:  Present  Psychomotor Activity:  Increased  Concentration:  Concentration: Fair and Attention Span: Fair  Recall:  [employer] of Knowledge: Good  Language: Good  Akathisia:  No  Handed:  Right  AIMS (if indicated): not done  Assets:  Communication Skills Desire for  Improvement Housing Transportation  ADL's:  Intact  Cognition: WNL  Sleep:  Fair   Screenings: PHQ2-9    Flowsheet Row Office Visit from 07/02/2016 in Primary Care at [facility] Total Score 0      Flowsheet Row ED from 10/24/2020 in [facility] Department at [facility] Video Visit from 07[facility]09/2020 in [facility]  C-SSRS RISK CATEGORY No Risk No Risk No Risk        Assessment and Plan: Patient appears somewhat anxious, little hypomanic with pressured speech.  I recommend should take the full tablet of Ativan because she is not sleeping well.  We also talk about optimizing Lamictal but patient does not want to change the Lamictal or Seroquel.  She agreed to take the full dose of Ativan to help her anxiety.  However she also agree if symptoms persist then she will call us and we we will adjust either Seroquel or Lamictal.  She agreed with the plan.  I reviewed blood work results.  Her hemoglobin A1c 5.8.  Her appetite is okay.  Discussed safety concerns and any time having active suicidal thoughts or homicidal thought then she need to call 911 or go to local emergency room.  Patient does not want to reduce her venlafaxine which seems to be working for her.  Follow-up in 3 months.  Patient like to have a follow-up appointment virtual.  Collaboration of Care: Collaboration of Care: Other provider involved in patient's care AEB notes are available in epic to review  Patient/Guardian was advised Release of Information must be obtained prior to any record release in order to collaborate their care with an outside provider. Patient/Guardian was advised if they have not already done so to contact the registration department to sign all necessary forms in order for [place] to release information regarding their care.   Consent: Patient/Guardian gives verbal consent  for treatment and assignment of benefits for services provided during this visit. Patient/Guardian expressed understanding and agreed to proceed.    Cleotis Nipper, MD 10/20/2022, 2:02 PM

## 2022-12-08 DIAGNOSIS — L570 Actinic keratosis: Secondary | ICD-10-CM | POA: Diagnosis not present

## 2022-12-08 DIAGNOSIS — D485 Neoplasm of uncertain behavior of skin: Secondary | ICD-10-CM | POA: Diagnosis not present

## 2022-12-08 DIAGNOSIS — D224 Melanocytic nevi of scalp and neck: Secondary | ICD-10-CM | POA: Diagnosis not present

## 2022-12-08 DIAGNOSIS — D1801 Hemangioma of skin and subcutaneous tissue: Secondary | ICD-10-CM | POA: Diagnosis not present

## 2022-12-08 DIAGNOSIS — L821 Other seborrheic keratosis: Secondary | ICD-10-CM | POA: Diagnosis not present

## 2022-12-08 DIAGNOSIS — D225 Melanocytic nevi of trunk: Secondary | ICD-10-CM | POA: Diagnosis not present

## 2022-12-08 DIAGNOSIS — C44321 Squamous cell carcinoma of skin of nose: Secondary | ICD-10-CM | POA: Diagnosis not present

## 2022-12-08 DIAGNOSIS — Z85828 Personal history of other malignant neoplasm of skin: Secondary | ICD-10-CM | POA: Diagnosis not present

## 2022-12-20 ENCOUNTER — Other Ambulatory Visit: Payer: Self-pay | Admitting: Internal Medicine

## 2022-12-20 DIAGNOSIS — Z1231 Encounter for screening mammogram for malignant neoplasm of breast: Secondary | ICD-10-CM

## 2023-01-16 DIAGNOSIS — Z85828 Personal history of other malignant neoplasm of skin: Secondary | ICD-10-CM | POA: Diagnosis not present

## 2023-01-16 DIAGNOSIS — C44321 Squamous cell carcinoma of skin of nose: Secondary | ICD-10-CM | POA: Diagnosis not present

## 2023-01-19 ENCOUNTER — Ambulatory Visit (HOSPITAL_COMMUNITY): Payer: TRICARE For Life (TFL) | Admitting: Psychiatry

## 2023-01-22 ENCOUNTER — Other Ambulatory Visit (HOSPITAL_COMMUNITY): Payer: Self-pay | Admitting: Psychiatry

## 2023-01-22 DIAGNOSIS — F411 Generalized anxiety disorder: Secondary | ICD-10-CM

## 2023-01-26 ENCOUNTER — Ambulatory Visit (HOSPITAL_COMMUNITY): Payer: Medicare Other | Admitting: Psychiatry

## 2023-01-26 ENCOUNTER — Encounter (HOSPITAL_COMMUNITY): Payer: Self-pay | Admitting: Psychiatry

## 2023-01-26 DIAGNOSIS — F411 Generalized anxiety disorder: Secondary | ICD-10-CM | POA: Diagnosis not present

## 2023-01-26 DIAGNOSIS — F319 Bipolar disorder, unspecified: Secondary | ICD-10-CM | POA: Diagnosis not present

## 2023-01-26 MED ORDER — LORAZEPAM 0.5 MG PO TABS: 0.5000 mg | ORAL_TABLET | Freq: Every day | ORAL | 2 refills | Status: DC

## 2023-01-26 MED ORDER — LAMOTRIGINE 200 MG PO TABS
200.0000 mg | ORAL_TABLET | Freq: Every day | ORAL | 2 refills | Status: DC
Start: 2023-01-26 — End: 2023-04-27

## 2023-01-26 MED ORDER — VENLAFAXINE HCL 100 MG PO TABS
100.0000 mg | ORAL_TABLET | Freq: Two times a day (BID) | ORAL | 0 refills | Status: DC
Start: 2023-01-26 — End: 2023-04-27

## 2023-01-26 MED ORDER — QUETIAPINE FUMARATE 50 MG PO TABS: 50.0000 mg | ORAL_TABLET | Freq: Every day | ORAL | 0 refills | Status: DC

## 2023-01-26 NOTE — Progress Notes (Addendum)
BH MD/PA/NP OP Progress Note  Patient location; office  Provider location; office  01/26/2023 9:17 AM Sandra Cox  MRN:  [id number]  Chief Complaint:  Chief Complaint  Patient presents with   Follow-up   HPI: Patient came in for her follow-up appointment.  She reported past 3 weeks were difficult she was having a lot of anxiety and could not sleep.  Now she is feeling better.  She reported and racing.  She wanted to keep herself busy and not to think about it.  She admitted claiming and nervousness.  She did use her coping skills to calm her down.  She reported her husband continues to visit once a month to stay with her a few days.  She admitted sometimes dysphoria and negative thoughts when she think about her past especially when she think about her dogs.  She used to have 6 dog she could not handle them and eventually give them away.  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  She has a good her friend's.  She denies any hallucination, paranoia but admitted highs and lows in her mood.  She is taking lorazepam 0.5 mg twice a day, and Afaxin 100 mg daily and Lamictal 150 mg daily.  She has no rash, itching, tremors or shakes.  She has no blood work in a while.  She see Dr Jilda Roche at [facility] physician.  She has no upcoming blood work for physical appointment scheduled with PCP.  She reported sometime tremors in her hand but it does not bother her on a daily basis.  Her appetite is okay.  She appears very emotional, labile, easy to cry and tearful when she talked about her dogs.  Her weight is unchanged from the past.  She denies any hallucination or any active suicidal thoughts but admitted a lot of ruminative thoughts and negative thoughts.  She used to see therapist but decided to stop after she was feeling better.   Visit Diagnosis:    ICD-10-CM   1. GAD (generalized anxiety disorder)  F41.1 lamoTRIgine (LAMICTAL) 200 MG tablet    venlafaxine (EFFEXOR) 100  MG tablet    LORazepam (ATIVAN) 0.5 MG tablet    2. Bipolar I disorder (HCC)  F31.9 lamoTRIgine (LAMICTAL) 200 MG tablet    QUEtiapine (SEROQUEL) 50 MG tablet      Past Psychiatric History: Reviewed H/O depression and mania. Saw psychiatrist in [place], [place] and [place]. Saw Lamarr Lulas at [facility] and given Seroquel, Latuda and Zoloft. Lithium and Latuda caused No h/o inpatient or suicidal attempt.      Past Medical History:  Past Medical History:  Diagnosis Date   Bipolar disorder (HCC)     Past Surgical History:  Procedure Laterality Date   CATARACT EXTRACTION Right    FACELIFT, LOWER 2/3  2014    Family Psychiatric History: Reviewed  Family History:  Family History  Problem Relation Age of Onset   Stroke Mother    Heart attack Father    Healthy Daughter    Breast cancer Neg Hx     Social History:  Social History   Socioeconomic History   Marital status: Married    Spouse name: Not on file   Number of children: 2   Years of education: Not on file   Highest education level: Bachelor's degree (e.g., BA, AB, BS)  Occupational History   Occupation: retired    Comment: Runner, broadcasting/film/video, 3rd grade x 7 years  Tobacco Use   Smoking status: Never   Smokeless tobacco: Never  Vaping Use   Vaping status: Never Used  Substance and Sexual Activity   Alcohol use: No   Drug use: No   Sexual activity: Not Currently  Other Topics Concern   Not on file  Social History Narrative   Not on file   Social Drivers of Health   Financial Resource Strain: Low Risk  (06/15/2017)   Overall Financial Resource Strain (CARDIA)    Difficulty of Paying Living Expenses: Not hard at all  Food Insecurity: No Food Insecurity (06/15/2017)   Hunger Vital Sign    Worried About Running Out of Food in the Last Year: Never true    Ran Out of Food in the Last Year: Never true  Transportation Needs: No Transportation Needs (06/15/2017)   PRAPARE - Administrator, Civil Service  (Medical): No    Lack of Transportation (Non-Medical): No  Physical Activity: Inactive (06/15/2017)   Exercise Vital Sign    Days of Exercise per Week: 0 days    Minutes of Exercise per Session: 0 min  Stress: Stress Concern Present (06/15/2017)   [employer] of Occupational Health - Occupational Stress Questionnaire    Feeling of Stress : To some extent  Social Connections: Unknown (04/27/2019)   [facility]   Social Connections    Frequency of Communication with Friends and Family: Not asked    Frequency of Social Gatherings with Friends and Family: Not asked    Allergies: No Known Allergies  Metabolic Disorder Labs: Lab Results  Component Value Date   HGBA1C 5.8 (H) 08/31/2022   No results found for: "PROLACTIN" No results found for: "CHOL", "TRIG", "HDL", "CHOLHDL", "VLDL", "LDLCALC" Lab Results  Component Value Date   TSH 0.82 07/04/2017    Therapeutic Level Labs: No results found for: "LITHIUM" No results found for: "VALPROATE" No results found for: "CBMZ"  Current Medications: Current Outpatient Medications  Medication Sig Dispense Refill   clobetasol (OLUX) 0.05 % topical foam   3   lamoTRIgine (LAMICTAL) 150 MG tablet Take 1 tablet (150 mg total) by mouth daily. 30 tablet 2   LORazepam (ATIVAN) 0.5 MG tablet Take 1 tablet (0.5 mg total) by mouth at bedtime. 30 tablet 2   QUEtiapine (SEROQUEL) 50 MG tablet Take 1 tablet (50 mg total) by mouth at bedtime. 90 tablet 0   rosuvastatin (CRESTOR) 10 MG tablet Take 10 mg by mouth daily.     venlafaxine (EFFEXOR) 100 MG tablet Take 1 tablet (100 mg total) by mouth 2 (two) times daily with a meal. 180 tablet 0   No current facility-administered medications for this visit.     Musculoskeletal: Strength & Muscle Tone: within normal limits Gait & Station: normal Patient leans: N/A  Psychiatric Specialty Exam: Review of Systems  Blood pressure 133/77, pulse 94, resp. rate 18, height  5\' 5"  (1.651 m), weight 132 lb 12.8 oz (60.2 kg).There is no height or weight on file to calculate BMI.  General Appearance: Well Groomed  Eye Contact:  Good  Speech:   fast  Volume:  Normal  Mood:  Anxious  Affect:  Labile  Thought Process:  Descriptions of Associations: Circumstantial  Orientation:  Full (Time, Place, and Person)  Thought Content: Rumination   Suicidal Thoughts:  No  Homicidal Thoughts:  No  Memory:  Immediate;   Good Recent;   Fair Remote;   Fair  Judgement:  Fair  Insight:  Fair  Psychomotor Activity:  Increased and Tremor  Concentration:  Concentration: Fair and Attention Span: Fair  Recall:  Good  Fund of Knowledge: Good  Language: Good  Akathisia:  No  Handed:  Right  AIMS (if indicated): not done  Assets:  Communication Skills Desire for Improvement Housing Social Support Transportation  ADL's:  Intact  Cognition: WNL  Sleep:  Fair   Screenings: PHQ2-9    Flowsheet Row Office Visit from 07/02/2016 in Primary Care at [facility] Total Score 0      Flowsheet Row ED from 10/24/2020 in [facility]  C-SSRS RISK CATEGORY No Risk No Risk No Risk        Assessment and Plan: Patient appears emotional, labile and hypomanic.  Speech is pressured.  We had discussed in the past demise the dose but she is reluctant but today she agreed to try increasing the Lamictal.  Currently she is taking 150 and agreed to take 200 mg since she has no major concerns including rash, itching.  I recommend if she notice any side effects specially worsening of tremors then should call us back immediately.  I will also do her labs as patient has not seen primary care and labs in a while.  We also talk about considering back to therapy but patient like to see if the  medication adjustment gets better.  We discussed safety concerns and any time having active suicidal thoughts or homicidal thought then she need to call 911 or go to local emergency room.  I encouraged to continue coping skills when she feels very nervous and anxious.  Continue venlafaxine 100 mg twice a day 0.5 mg at bedtime and if needed she can take extra half tablet, Seroquel 50 mg at bedtime.  We will follow-up in 3 months unless patient required an earlier ointment.  Collaboration of Care: Collaboration of Care: Other provider involved in patient's care AEB notes are available in epic to review  Patient/Guardian was advised Release of Information must be obtained prior to any record release in order to collaborate their care with an outside provider. Patient/Guardian was advised if they have not already done so to contact the registration department to sign all necessary forms in order for [place] to release information regarding their care.   Consent: Patient/Guardian gives verbal consent for treatment and assignment of benefits for services provided during this visit. Patient/Guardian expressed understanding and agreed to proceed.   I provided 29 minutes face-to-face time during this encounter.   Cleotis Nipper, MD 01/26/2023, 9:17 AM

## 2023-02-23 ENCOUNTER — Telehealth (HOSPITAL_COMMUNITY): Payer: Self-pay

## 2023-02-23 ENCOUNTER — Ambulatory Visit (HOSPITAL_COMMUNITY): Payer: Medicare Other

## 2023-02-23 DIAGNOSIS — Z79899 Other long term (current) drug therapy: Secondary | ICD-10-CM | POA: Diagnosis not present

## 2023-02-23 NOTE — Progress Notes (Signed)
Patient arrived today for her due labs, patient tolerated well and without complaint.

## 2023-02-23 NOTE — Telephone Encounter (Signed)
Lamictal helps the mood lability.  I have encouraged to keep the current medication dose until I see her next time.

## 2023-02-23 NOTE — Telephone Encounter (Signed)
Patient came in today for her blood work, she wanted me to let you know that she thinks the increase in Lamictal has triggered her mania. Patient says she feels great, like she can do it all and she knows that is not a good thing. She wants to know if you would consider going back down. Please review and advise, thank you

## 2023-02-24 LAB — CBC WITH DIFFERENTIAL/PLATELET
Basophils Absolute: 0.1 10*3/uL (ref 0.0–0.2)
Basos: 1 %
EOS (ABSOLUTE): 0.2 10*3/uL (ref 0.0–0.4)
Eos: 4 %
Hematocrit: 40.3 % (ref 34.0–46.6)
Hemoglobin: 13.1 g/dL (ref 11.1–15.9)
Immature Grans (Abs): 0 10*3/uL (ref 0.0–0.1)
Immature Granulocytes: 0 %
Lymphocytes Absolute: 1.1 10*3/uL (ref 0.7–3.1)
Lymphs: 27 %
MCH: 31.6 pg (ref 26.6–33.0)
MCHC: 32.5 g/dL (ref 31.5–35.7)
MCV: 97 fL (ref 79–97)
Monocytes Absolute: 0.4 10*3/uL (ref 0.1–0.9)
Monocytes: 10 %
Neutrophils Absolute: 2.4 10*3/uL (ref 1.4–7.0)
Neutrophils: 58 %
Platelets: 172 10*3/uL (ref 150–450)
RBC: 4.14 x10E6/uL (ref 3.77–5.28)
RDW: 12.5 % (ref 11.7–15.4)
WBC: 4.1 10*3/uL (ref 3.4–10.8)

## 2023-02-24 LAB — COMPREHENSIVE METABOLIC PANEL
ALT: 9 [IU]/L (ref 0–32)
AST: 18 [IU]/L (ref 0–40)
Albumin: 4.4 g/dL (ref 3.8–4.8)
Alkaline Phosphatase: 81 [IU]/L (ref 44–121)
BUN/Creatinine Ratio: 17 (ref 12–28)
BUN: 18 mg/dL (ref 8–27)
Bilirubin Total: 0.3 mg/dL (ref 0.0–1.2)
CO2: 24 mmol/L (ref 20–29)
Calcium: 9.9 mg/dL (ref 8.7–10.3)
Chloride: 105 mmol/L (ref 96–106)
Creatinine, Ser: 1.09 mg/dL — ABNORMAL HIGH (ref 0.57–1.00)
Globulin, Total: 2.1 g/dL (ref 1.5–4.5)
Glucose: 101 mg/dL — ABNORMAL HIGH (ref 70–99)
Potassium: 4.2 mmol/L (ref 3.5–5.2)
Sodium: 142 mmol/L (ref 134–144)
Total Protein: 6.5 g/dL (ref 6.0–8.5)
eGFR: 52 mL/min/{1.73_m2} — ABNORMAL LOW (ref >59)

## 2023-02-24 LAB — HEMOGLOBIN A1C
Est. average glucose Bld gHb Est-mCnc: 120 mg/dL
Hgb A1c MFr Bld: 5.8 % — ABNORMAL HIGH (ref 4.8–5.6)

## 2023-02-24 NOTE — Telephone Encounter (Signed)
I spoke to patient today and she said that she is still feeling very hyper and can not settle down, she said if it is not the Lamictal can she cut the Effexor back some? Please review and advise, thank you

## 2023-02-24 NOTE — Telephone Encounter (Signed)
She can cut down to Lamictal back to 150.

## 2023-03-01 NOTE — Telephone Encounter (Signed)
Called patient and left a voicemail for her to call me back.

## 2023-03-13 DIAGNOSIS — L570 Actinic keratosis: Secondary | ICD-10-CM | POA: Diagnosis not present

## 2023-03-13 DIAGNOSIS — Z4800 Encounter for change or removal of nonsurgical wound dressing: Secondary | ICD-10-CM | POA: Diagnosis not present

## 2023-04-03 DIAGNOSIS — Z961 Presence of intraocular lens: Secondary | ICD-10-CM | POA: Diagnosis not present

## 2023-04-06 ENCOUNTER — Other Ambulatory Visit (HOSPITAL_COMMUNITY): Payer: Self-pay | Admitting: Psychiatry

## 2023-04-06 ENCOUNTER — Ambulatory Visit (HOSPITAL_COMMUNITY): Payer: TRICARE For Life (TFL)

## 2023-04-06 DIAGNOSIS — F411 Generalized anxiety disorder: Secondary | ICD-10-CM

## 2023-04-06 DIAGNOSIS — F319 Bipolar disorder, unspecified: Secondary | ICD-10-CM

## 2023-04-27 ENCOUNTER — Encounter (HOSPITAL_COMMUNITY): Payer: Self-pay | Admitting: Psychiatry

## 2023-04-27 ENCOUNTER — Ambulatory Visit (HOSPITAL_BASED_OUTPATIENT_CLINIC_OR_DEPARTMENT_OTHER): Payer: TRICARE For Life (TFL) | Admitting: Psychiatry

## 2023-04-27 DIAGNOSIS — F319 Bipolar disorder, unspecified: Secondary | ICD-10-CM

## 2023-04-27 DIAGNOSIS — F411 Generalized anxiety disorder: Secondary | ICD-10-CM | POA: Diagnosis not present

## 2023-04-27 MED ORDER — QUETIAPINE FUMARATE 50 MG PO TABS: 50.0000 mg | ORAL_TABLET | Freq: Every day | ORAL | 0 refills | Status: DC

## 2023-04-27 MED ORDER — LAMOTRIGINE 150 MG PO TABS: 150.0000 mg | ORAL_TABLET | Freq: Every day | ORAL | 0 refills | Status: DC

## 2023-04-27 MED ORDER — VENLAFAXINE HCL 100 MG PO TABS: 100.0000 mg | ORAL_TABLET | Freq: Two times a day (BID) | ORAL | 0 refills | Status: DC

## 2023-04-27 MED ORDER — LORAZEPAM 0.5 MG PO TABS: 0.2500 mg | ORAL_TABLET | Freq: Every day | ORAL | 1 refills | Status: DC

## 2023-04-27 NOTE — Progress Notes (Signed)
 BH MD/PA/NP OP Progress Note  Patient location; office  Provider location; office  04/27/2023 10:50 AM Sandra Cox  MRN:  [id number]  Chief Complaint:  Chief Complaint  Patient presents with   Follow-up   HPI: Patient came today for her follow-up appointment.  On the last visit we increased Lamictal 200 after she noticed hypomanic, increased energy, nervousness and anxiety.  She could not sleep.  After increasing the Lamictal patient called back few weeks later that she is not doing very well and like to go back to previous dose of lamotrigine 150.  We recommended to go back to previous dose and she is taking 150.  She noticed much improvement since the dose reduced.  She does not feel as manic.  She noticed sleep is much improved.  She find new people to engage herself to play chess.  She celebrated very well her 78th birthday in January.  She has no rash, itching, tremors or shakes.  She denies any hallucination, paranoia or any suicidal thoughts.  She feels her energy level is appropriate and she is able to do things without any issues.  Her primary care physician is at [facility].  Patient wondering if she need to get B12 level checked.  She is still sometimes emotional and labile but denies any crying spells or any irritability.  Her appetite is okay.  Her weight is stable.  She continues to try to help the lady who has dementia and keep her daughters if needed.  She has a good social network.  She had cut down her Ativan and only taking 0.25 mg only at bedtime.  She is also compliant with Seroquel, Effexor.  She denies drinking or using any illegal substances.  She had blood work and results reviewed.  Visit Diagnosis:    ICD-10-CM   1. Bipolar I disorder (HCC)  F31.9 QUEtiapine (SEROQUEL) 50 MG tablet    lamoTRIgine (LAMICTAL) 150 MG tablet    2. GAD (generalized anxiety disorder)  F41.1 venlafaxine (EFFEXOR) 100 MG tablet    lamoTRIgine (LAMICTAL) 150 MG tablet    LORazepam (ATIVAN) 0.5  MG tablet       Past Psychiatric History: Reviewed H/O depression and mania. Saw psychiatrist in [place], [place] and [place]. Saw Lamarr Lulas at [facility] and given Seroquel, Latuda and Zoloft. Lithium and Latuda caused No h/o inpatient or suicidal attempt.      Past Medical History:  Past Medical History:  Diagnosis Date   Bipolar disorder (HCC)     Past Surgical History:  Procedure Laterality Date   CATARACT EXTRACTION Right    FACELIFT, LOWER 2/3  2014    Family Psychiatric History: Reviewed  Family History:  Family History  Problem Relation Age of Onset   Stroke Mother    Heart attack Father    Healthy Daughter    Breast cancer Neg Hx     Social History:  Social History   Socioeconomic History   Marital status: Married    Spouse name: Not on file   Number of children: 2   Years of education: Not on file   Highest education level: Bachelor's degree (e.g., BA, AB, BS)  Occupational History   Occupation: retired    Comment: Runner, broadcasting/film/video, 3rd grade x 7 years  Tobacco Use   Smoking status: Never   Smokeless tobacco: Never  Vaping Use   Vaping status: Never Used  Substance and Sexual Activity   Alcohol use: No   Drug use: No   Sexual  activity: Not Currently  Other Topics Concern   Not on file  Social History Narrative   Not on file   Social Drivers of Health   Financial Resource Strain: Low Risk  (06/15/2017)   Overall Financial Resource Strain (CARDIA)    Difficulty of Paying Living Expenses: Not hard at all  Food Insecurity: No Food Insecurity (06/15/2017)   Hunger Vital Sign    Worried About Running Out of Food in the Last Year: Never true    Ran Out of Food in the Last Year: Never true  Transportation Needs: No Transportation Needs (06/15/2017)   PRAPARE - Administrator, Civil Service (Medical): No    Lack of Transportation (Non-Medical): No  Physical Activity: Inactive (06/15/2017)   Exercise Vital Sign    Days of Exercise per  Week: 0 days    Minutes of Exercise per Session: 0 min  Stress: Stress Concern Present (06/15/2017)   [employer] of Occupational Health - Occupational Stress Questionnaire    Feeling of Stress : To some extent  Social Connections: Unknown (04/27/2019)   Received from [facility]   Social Connections    Frequency of Communication with Friends and Family: Not asked    Frequency of Social Gatherings with Friends and Family: Not asked    Allergies: No Known Allergies  Metabolic Disorder Labs: Lab Results  Component Value Date   HGBA1C 5.8 (H) 02/23/2023   No results found for: "PROLACTIN" No results found for: "CHOL", "TRIG", "HDL", "CHOLHDL", "VLDL", "LDLCALC" Lab Results  Component Value Date   TSH 0.82 07/04/2017    Therapeutic Level Labs: No results found for: "LITHIUM" No results found for: "VALPROATE" No results found for: "CBMZ"  Current Medications: Current Outpatient Medications  Medication Sig Dispense Refill   clobetasol (OLUX) 0.05 % topical foam   3   lamoTRIgine (LAMICTAL) 200 MG tablet Take 1 tablet (200 mg total) by mouth daily. 30 tablet 2   LORazepam (ATIVAN) 0.5 MG tablet Take 1 tablet (0.5 mg total) by mouth at bedtime. 30 tablet 2   QUEtiapine (SEROQUEL) 50 MG tablet Take 1 tablet (50 mg total) by mouth at bedtime. 90 tablet 0   rosuvastatin (CRESTOR) 10 MG tablet Take 10 mg by mouth daily.     venlafaxine (EFFEXOR) 100 MG tablet Take 1 tablet (100 mg total) by mouth 2 (two) times daily with a meal. 180 tablet 0   No current facility-administered medications for this visit.     Musculoskeletal: Strength & Muscle Tone: within normal limits Gait & Station: normal Patient leans: N/A  Psychiatric Specialty Exam: Review of Systems  Blood pressure 124/76, pulse 83, resp. rate 18, height 5\' 4"  (1.626 m), weight 129 lb 6.4 oz (58.7 kg).Body mass index is 22.21 kg/m.  General Appearance: Well Groomed  Eye Contact:  Good  Speech:   Normal Rate  Volume:  Normal  Mood:  Euthymic  Affect:  Labile  Thought Process:  Goal Directed  Orientation:  Full (Time, Place, and Person)  Thought Content: Rumination   Suicidal Thoughts:  No  Homicidal Thoughts:  No  Memory:  Immediate;   Good Recent;   Fair Remote;   Fair  Judgement:  Fair  Insight:  Fair  Psychomotor Activity:  Tremor  Concentration:  Concentration: Fair and Attention Span: Fair  Recall:  Good  Fund of Knowledge: Good  Language: Good  Akathisia:  No  Handed:  Right  AIMS (if indicated): not done  Assets:  Communication Skills Desire for Improvement Housing Social Support Transportation  ADL's:  Intact  Cognition: WNL  Sleep:  Good   Screenings: PHQ2-9    Flowsheet Row Office Visit from 07/02/2016 in [facility] Care at [facility] Total Score 0      Flowsheet Row ED from 10/24/2020 in [facility] Video [facility] Video Visit from [facility]/08[facility]2020 [facility]  C-SSRS RISK CATEGORY No Risk No Risk No Risk        Assessment and Plan: Patient back to Lamictal 150 after could not tolerate 200 mg.  She noticed her mood is stable.  Her mania is under control and she is sleeping better.  She also had labs and results are okay.  Her creatinine 1.09 which was initially 1.22.  I reviewed the blood work results with her.  She is pleased with the findings.  She had cut down her Ativan and only taking 0.25 mg at bedtime.  Continue venlafaxine 100 mg twice a day, Seroquel 50 mg at bedtime, reduced Lamictal back to 150 daily.  She has no rash, itching, tremors or shakes.  Encouraged to call us back if she has any question or any concern.  Follow-up in 3 months.  Collaboration of Care: Collaboration of Care: Other provider involved in patient's care AEB notes are available in epic to review  Patient/Guardian was advised  Release of Information must be obtained prior to any record release in order to collaborate their care with an outside provider. Patient/Guardian was advised if they have not already done so to contact the registration department to sign all necessary forms in order for [place] to release information regarding their care.   Consent: Patient/Guardian gives verbal consent for treatment and assignment of benefits for services provided during this visit. Patient/Guardian expressed understanding and agreed to proceed.   I provided 27 minutes face-to-face time during this encounter.   Cleotis Nipper, MD 04/27/2023, 10:50 AM

## 2023-06-05 DIAGNOSIS — L821 Other seborrheic keratosis: Secondary | ICD-10-CM | POA: Diagnosis not present

## 2023-06-05 DIAGNOSIS — L820 Inflamed seborrheic keratosis: Secondary | ICD-10-CM | POA: Diagnosis not present

## 2023-06-05 DIAGNOSIS — L853 Xerosis cutis: Secondary | ICD-10-CM | POA: Diagnosis not present

## 2023-06-05 DIAGNOSIS — Z85828 Personal history of other malignant neoplasm of skin: Secondary | ICD-10-CM | POA: Diagnosis not present

## 2023-06-22 DIAGNOSIS — L570 Actinic keratosis: Secondary | ICD-10-CM | POA: Diagnosis not present

## 2023-06-22 DIAGNOSIS — Z85828 Personal history of other malignant neoplasm of skin: Secondary | ICD-10-CM | POA: Diagnosis not present

## 2023-07-27 ENCOUNTER — Other Ambulatory Visit: Payer: Self-pay

## 2023-07-27 ENCOUNTER — Ambulatory Visit (HOSPITAL_BASED_OUTPATIENT_CLINIC_OR_DEPARTMENT_OTHER): Admitting: Psychiatry

## 2023-07-27 ENCOUNTER — Encounter (HOSPITAL_COMMUNITY): Payer: Self-pay | Admitting: Psychiatry

## 2023-07-27 DIAGNOSIS — F411 Generalized anxiety disorder: Secondary | ICD-10-CM | POA: Diagnosis not present

## 2023-07-27 DIAGNOSIS — F319 Bipolar disorder, unspecified: Secondary | ICD-10-CM | POA: Diagnosis not present

## 2023-07-27 MED ORDER — QUETIAPINE FUMARATE 50 MG PO TABS: 50.0000 mg | ORAL_TABLET | Freq: Every day | ORAL | 0 refills | Status: DC

## 2023-07-27 MED ORDER — LORAZEPAM 0.5 MG PO TABS: 0.2500 mg | ORAL_TABLET | Freq: Every day | ORAL | 1 refills | Status: DC

## 2023-07-27 MED ORDER — LAMOTRIGINE 150 MG PO TABS: 150.0000 mg | ORAL_TABLET | Freq: Every day | ORAL | 0 refills | Status: DC

## 2023-07-27 MED ORDER — VENLAFAXINE HCL 100 MG PO TABS: 100.0000 mg | ORAL_TABLET | Freq: Two times a day (BID) | ORAL | 0 refills | Status: DC

## 2023-07-27 NOTE — Progress Notes (Signed)
 BH MD/PA/NP OP Progress Note  Patient location; office  Provider location; office  07/27/2023 3:06 PM Sandra Cox  MRN:  [id number]  Chief Complaint:  Chief Complaint  Patient presents with   Follow-up   HPI: Patient came today for her follow-up appointment.  She reported things are going okay but she is concerned about her husband's who seems to be not happy with the girlfriend he is living for past 8 years.  Patient told it was a mutual agreement between her and her husband that he can stay with his girlfriend and they will be still married.  Patient told husband comes once a month to visit but lately she noticed he is not as happy.  Patient told that girl he is staying is not supportive and cooperative and sometimes yells on husband.  Patient realize it was her husband's decision and she supported to move out but now she feels sorry about the husband.  Patient did not indicate any plan to live together as she is very happy living by herself.  She has good social network she also in touch with her daughter.  She plays chess every day with 4 people and she really enjoys it.  Her appetite is okay.  Her weight is stable.  She denies any mania, psychosis, hallucination.  She has no rash, itching tremors or shakes.  She has not changed any of her medication.  She is taking quetiapine , Lamictal , Effexor  and occasionally Ativan  half tablet.  Visit Diagnosis:    ICD-10-CM   1. Bipolar I disorder (HCC)  F31.9 lamoTRIgine  (LAMICTAL ) 150 MG tablet    QUEtiapine  (SEROQUEL ) 50 MG tablet    2. GAD (generalized anxiety disorder)  F41.1 lamoTRIgine  (LAMICTAL ) 150 MG tablet    LORazepam  (ATIVAN ) 0.5 MG tablet    venlafaxine  (EFFEXOR ) 100 MG tablet        Past Psychiatric History: Reviewed H/O depression and mania. Saw psychiatrist in [place] , [place] and [place]. Saw Edwina Gram Pullis at [facility] and given Seroquel , Latuda and Zoloft. Lithium  and Latuda caused No h/o inpatient or suicidal  attempt.      Past Medical History:  Past Medical History:  Diagnosis Date   Bipolar disorder (HCC)     Past Surgical History:  Procedure Laterality Date   CATARACT EXTRACTION Right    FACELIFT, LOWER 2/3  2014    Family Psychiatric History: Reviewed  Family History:  Family History  Problem Relation Age of Onset   Stroke Mother    Heart attack Father    Healthy Daughter    Breast cancer Neg Hx     Social History:  Social History   Socioeconomic History   Marital status: Married    Spouse name: Not on file   Number of children: 2   Years of education: Not on file   Highest education level: Bachelor's degree (e.g., BA, AB, BS)  Occupational History   Occupation: retired    Comment: Runner, broadcasting/film/video, 3rd grade x 7 years  Tobacco Use   Smoking status: Never   Smokeless tobacco: Never  Vaping Use   Vaping status: Never Used  Substance and Sexual Activity   Alcohol use: No   Drug use: No   Sexual activity: Not Currently  Other Topics Concern   Not on file  Social History Narrative   Not on file   Social Drivers of Health   Financial Resource Strain: Low Risk  (06/15/2017)   Overall Financial Resource Strain (CARDIA)    Difficulty of Paying  Living Expenses: Not hard at all  Food Insecurity: No Food Insecurity (06/15/2017)   Hunger Vital Sign    Worried About Running Out of Food in the Last Year: Never true    Ran Out of Food in the Last Year: Never true  Transportation Needs: No Transportation Needs (06/15/2017)   PRAPARE - Administrator, Civil Service (Medical): No    Lack of Transportation (Non-Medical): No  Physical Activity: Inactive (06/15/2017)   Exercise Vital Sign    Days of Exercise per Week: 0 days    Minutes of Exercise per Session: 0 min  Stress: Stress Concern Present (06/15/2017)   [employer] of Occupational Health - Occupational Stress Questionnaire    Feeling of Stress : To some extent  Social Connections: Unknown (04/27/2019)   Received  from [facility]   Social Connections    Frequency of Communication with Friends and Family: Not asked    Frequency of Social Gatherings with Friends and Family: Not asked    Allergies: No Known Allergies  Metabolic Disorder Labs: Lab Results  Component Value Date   HGBA1C 5.8 (H) 02/23/2023   No results found for: PROLACTIN No results found for: CHOL, TRIG, HDL, CHOLHDL, VLDL, LDLCALC Lab Results  Component Value Date   TSH 0.82 07/04/2017    Therapeutic Level Labs: No results found for: LITHIUM  No results found for: VALPROATE No results found for: CBMZ  Current Medications: Current Outpatient Medications  Medication Sig Dispense Refill   clobetasol (OLUX) 0.05 % topical foam   3   lamoTRIgine  (LAMICTAL ) 150 MG tablet Take 1 tablet (150 mg total) by mouth daily. 90 tablet 0   LORazepam  (ATIVAN ) 0.5 MG tablet Take 0.5-1 tablets (0.25-0.5 mg total) by mouth at bedtime. 30 tablet 1   QUEtiapine  (SEROQUEL ) 50 MG tablet Take 1 tablet (50 mg total) by mouth at bedtime. 90 tablet 0   rosuvastatin (CRESTOR) 10 MG tablet Take 10 mg by mouth daily.     venlafaxine  (EFFEXOR ) 100 MG tablet Take 1 tablet (100 mg total) by mouth 2 (two) times daily with a meal. 180 tablet 0   No current facility-administered medications for this visit.     Musculoskeletal: Strength & Muscle Tone: within normal limits Gait & Station: normal Patient leans: N/A  Psychiatric Specialty Exam: Review of Systems  Blood pressure 127/84, pulse 90, height 5' 5 (1.651 m), weight 128 lb (58.1 kg).Body mass index is 21.3 kg/m.  General Appearance: Well Groomed  Eye Contact:  Good  Speech:  fast  Volume:  Normal  Mood:  Anxious  Affect:  Congruent  Thought Process:  Goal Directed  Orientation:  Full (Time, Place, and Person)  Thought Content: Rumination   Suicidal Thoughts:  No  Homicidal Thoughts:  No  Memory:  Immediate;   Good Recent;   Good Remote;   Good  Judgement:  Fair   Insight:  Fair  Psychomotor Activity:  Tremor  Concentration:  Concentration: Fair and Attention Span: Fair  Recall:  Good  Fund of Knowledge: Good  Language: Good  Akathisia:  No  Handed:  Right  AIMS (if indicated): not done  Assets:  Communication Skills Desire for Improvement Housing Social Support Transportation  ADL's:  Intact  Cognition: WNL  Sleep:  Good   Screenings: PHQ2-9    Flowsheet Row Office Visit from 07/02/2016 in Primary Care at [facility] Total Score 0   Flowsheet Row ED from [facility]/24/2020 in [facility] Video Visit from 04/08/2020 in [facility]  C-SSRS RISK CATEGORY No Risk No Risk No Risk     Assessment and Plan: Patient is a stable on current medication.  She is taking Lamictal  150 as 200 could not tolerate very well.  Her mood is stable.  Some concern about her husband but do not believe she is going to change the living plan.  Her PCP is Dr. Candi Chafe and no new medication added.  She has no concern or issues with the current medication.  Continue Lamictal  150 mg daily, venlafaxine  100 mg twice a day, Seroquel  50 mg at bedtime, Klonopin 0.5 mg half to 1 tablet as needed for anxiety.  Her last creatinine was 1.09.  Recommend to call us  back if she is any question or any concern.  Follow-up in 3 months.  Collaboration of Care: Collaboration of Care: Other provider involved in patient's care AEB notes are available in epic to review  Patient/Guardian was advised Release of Information must be obtained prior to any record release in order to collaborate their care with an outside provider. Patient/Guardian was advised if they have not already done so to contact the registration department to sign all necessary forms in order for us  to release information regarding their care.   Consent: Patient/Guardian  gives verbal consent for treatment and assignment of benefits for services provided during this visit. Patient/Guardian expressed understanding and agreed to proceed.   I provided 27 minutes face-to-face time during this encounter.   Arturo Late, MD 07/27/2023, 3:06 PM

## 2023-10-19 ENCOUNTER — Encounter (HOSPITAL_COMMUNITY): Payer: Self-pay | Admitting: Psychiatry

## 2023-10-19 ENCOUNTER — Ambulatory Visit (HOSPITAL_BASED_OUTPATIENT_CLINIC_OR_DEPARTMENT_OTHER): Admitting: Psychiatry

## 2023-10-19 ENCOUNTER — Ambulatory Visit (HOSPITAL_COMMUNITY): Admitting: Psychiatry

## 2023-10-19 VITALS — Wt 129.0 lb

## 2023-10-19 DIAGNOSIS — F319 Bipolar disorder, unspecified: Secondary | ICD-10-CM

## 2023-10-19 DIAGNOSIS — F411 Generalized anxiety disorder: Secondary | ICD-10-CM | POA: Diagnosis not present

## 2023-10-19 MED ORDER — QUETIAPINE FUMARATE 50 MG PO TABS: 50.0000 mg | ORAL_TABLET | Freq: Every day | ORAL | 0 refills | Status: DC

## 2023-10-19 MED ORDER — LAMOTRIGINE 150 MG PO TABS
150.0000 mg | ORAL_TABLET | Freq: Every day | ORAL | 0 refills | Status: AC
Start: 2023-10-19 — End: ?

## 2023-10-19 MED ORDER — LORAZEPAM 0.5 MG PO TABS: 0.2500 mg | ORAL_TABLET | Freq: Every day | ORAL | 1 refills | Status: DC

## 2023-10-19 MED ORDER — VENLAFAXINE HCL 100 MG PO TABS: 100.0000 mg | ORAL_TABLET | Freq: Two times a day (BID) | ORAL | 0 refills | Status: DC

## 2023-10-19 NOTE — Progress Notes (Signed)
 BH MD/PA/NP OP Progress Note  Patient location; office  Provider location; office  10/19/2023 11:49 AM Sandra Cox  MRN:  [id number]  Chief Complaint:  Chief Complaint  Patient presents with   Follow-up   Medication Refill   HPI: Patient came for her follow-up appointment in the office.  She feels good and she reported symptoms are stable.  She denies any crying spells.  There are days when she feels more upbeat with increased energy but denies any mania or any impulsive behavior.  She started doing planks and exercise which she believe keeping her mood pleasant.  She is trying to do exercise that can help her balance.  She continues to play chess.  Her friend that she thought chess died recently and she was sad about.  She has few other friends.  She sleeps good.  Sometimes she has racing thoughts but admitted going to bed late.  She enjoys in the evening a glass of brandy and ice cream.  She denies any panic attack, crying spells, hopelessness.  She is pleased that relationship with her husband is much better.  She is staying longer on the visit with her husband.  Her appetite is okay.  Her weight is stable.  She has no tremor or shakes or any EPS.  She denies any aggression or violence.  She is compliant with Seroquel , Lamictal , Effexor  and half tablet of Ativan .  Visit Diagnosis:    ICD-10-CM   1. Bipolar I disorder (HCC)  F31.9 QUEtiapine  (SEROQUEL ) 50 MG tablet    lamoTRIgine  (LAMICTAL ) 150 MG tablet    2. GAD (generalized anxiety disorder)  F41.1 lamoTRIgine  (LAMICTAL ) 150 MG tablet    LORazepam  (ATIVAN ) 0.5 MG tablet    venlafaxine  (EFFEXOR ) 100 MG tablet       Past Psychiatric History: Reviewed H/O depression and mania. Saw psychiatrist in [place] , [place] and [place]. Saw Olam Pullis at [facility] and given Seroquel , Latuda and Zoloft. Lithium  and Latuda caused No h/o inpatient or suicidal attempt.      Past Medical History:  Past Medical History:  Diagnosis  Date   Bipolar disorder (HCC)     Past Surgical History:  Procedure Laterality Date   CATARACT EXTRACTION Right    FACELIFT, LOWER 2/3  2014    Family Psychiatric History: Reviewed  Family History:  Family History  Problem Relation Age of Onset   Stroke Mother    Heart attack Father    Healthy Daughter    Breast cancer Neg Hx     Social History:  Social History   Socioeconomic History   Marital status: Married    Spouse name: Not on file   Number of children: 2   Years of education: Not on file   Highest education level: Bachelor's degree (e.g., BA, AB, BS)  Occupational History   Occupation: retired    Comment: Runner, broadcasting/film/video, 3rd grade x 7 years  Tobacco Use   Smoking status: Never   Smokeless tobacco: Never  Vaping Use   Vaping status: Never Used  Substance and Sexual Activity   Alcohol use: No   Drug use: No   Sexual activity: Not Currently  Other Topics Concern   Not on file  Social History Narrative   Not on file   Social Drivers of Health   Financial Resource Strain: Low Risk  (06/15/2017)   Overall Financial Resource Strain (CARDIA)    Difficulty of Paying Living Expenses: Not hard at all  Food Insecurity: No Food Insecurity (06/15/2017)  Hunger Vital Sign    Worried About Running Out of Food in the Last Year: Never true    Ran Out of Food in the Last Year: Never true  Transportation Needs: No Transportation Needs (06/15/2017)   PRAPARE - Administrator, Civil Service (Medical): No    Lack of Transportation (Non-Medical): No  Physical Activity: Inactive (06/15/2017)   Exercise Vital Sign    Days of Exercise per Week: 0 days    Minutes of Exercise per Session: 0 min  Stress: Stress Concern Present (06/15/2017)   [employer] of Occupational Health - Occupational Stress Questionnaire    Feeling of Stress : To some extent  Social Connections: Unknown (04/27/2019)   Received from [facility]   Social Connections    Frequency of Communication  with Friends and Family: Not asked    Frequency of Social Gatherings with Friends and Family: Not asked    Allergies: No Known Allergies  Metabolic Disorder Labs: Lab Results  Component Value Date   HGBA1C 5.8 (H) 02/23/2023   No results found for: PROLACTIN No results found for: CHOL, TRIG, HDL, CHOLHDL, VLDL, LDLCALC Lab Results  Component Value Date   TSH 0.82 07/04/2017    Therapeutic Level Labs: No results found for: LITHIUM  No results found for: VALPROATE No results found for: CBMZ  Current Medications: Current Outpatient Medications  Medication Sig Dispense Refill   clobetasol (OLUX) 0.05 % topical foam   3   lamoTRIgine  (LAMICTAL ) 150 MG tablet Take 1 tablet (150 mg total) by mouth daily. 90 tablet 0   LORazepam  (ATIVAN ) 0.5 MG tablet Take 0.5-1 tablets (0.25-0.5 mg total) by mouth at bedtime. 30 tablet 1   QUEtiapine  (SEROQUEL ) 50 MG tablet Take 1 tablet (50 mg total) by mouth at bedtime. 90 tablet 0   rosuvastatin (CRESTOR) 10 MG tablet Take 10 mg by mouth daily.     venlafaxine  (EFFEXOR ) 100 MG tablet Take 1 tablet (100 mg total) by mouth 2 (two) times daily with a meal. 180 tablet 0   No current facility-administered medications for this visit.     Musculoskeletal: Strength & Muscle Tone: within normal limits Gait & Station: normal Patient leans: N/A  Psychiatric Specialty Exam: Review of Systems  Weight 129 lb (58.5 kg).There is no height or weight on file to calculate BMI.  General Appearance: Well Groomed  Eye Contact:  Good  Speech:  fast  Volume:  Normal  Mood:  pleasant   Affect:  Congruent  Thought Process:  Goal Directed  Orientation:  Full (Time, Place, and Person)  Thought Content: WDL   Suicidal Thoughts:  No  Homicidal Thoughts:  No  Memory:  Immediate;   Good Recent;   Good Remote;   Good  Judgement:  Fair  Insight:  Fair  Psychomotor Activity:  Tremor  Concentration:  Concentration: Fair and Attention Span:  Fair  Recall:  Good  Fund of Knowledge: Good  Language: Good  Akathisia:  No  Handed:  Right  AIMS (if indicated): not done  Assets:  Communication Skills Desire for Improvement Housing Social Support Transportation  ADL's:  Intact  Cognition: WNL  Sleep:  Good   Screenings: PHQ2-9    Flowsheet Row Office Visit from 07/02/2016 in Primary Care at [facility] Total Score 0   Flowsheet Row ED from 10/24/2020 in [facility] at [facility] Video Visit from 07/09/2020 [facility] Video Visit from 04/08/2020 [facility]  C-SSRS RISK CATEGORY No Risk No Risk No Risk     Assessment and Plan: Patient is 78 year old female with history of bipolar disorder, anxiety doing better on current medication.  She has no rash or any itching.  Emphasis provided about psychotropic medication and alcohol.  Possible interaction can cause worsening of symptoms and side effects.  Patient agree and she will consider cutting down her drinking.  Overall she feels the medicine working and does not want to change.  Continue Lamictal  150 mg daily, venlafaxine  100 mg twice a day, Seroquel  50 mg at bedtime and Ativan  half to 1 tablet as needed for anxiety.  Encourage hydration and continue exercise.  Her last creatinine was 1.09.  She has appointment coming up to see her primary care in few weeks.  Encouraged to keep that appointment.  Recommend to call back if she is any question or any concern.  Follow-up in 3 months.  Collaboration of Care: Collaboration of Care: Other provider involved in patient's care AEB notes are available in epic to review  Patient/Guardian was advised Release of Information must be obtained prior to any record release in order to collaborate their care with an outside provider. Patient/Guardian was advised if they have not already done so to contact the registration department to  sign all necessary forms in order for us  to release information regarding their care.   Consent: Patient/Guardian gives verbal consent for treatment and assignment of benefits for services provided during this visit. Patient/Guardian expressed understanding and agreed to proceed.   I provided 17 minutes face-to-face time during this encounter.   Leni ONEIDA Client, MD 10/19/2023, 11:49 AM

## 2023-10-20 ENCOUNTER — Ambulatory Visit
Admission: RE | Admit: 2023-10-20 | Discharge: 2023-10-20 | Disposition: A | Discharge status: Home / Self Care | Payer: Medicare Other | Source: Ambulatory Visit | Attending: Internal Medicine | Admitting: Internal Medicine

## 2023-10-20 DIAGNOSIS — Z1231 Encounter for screening mammogram for malignant neoplasm of breast: Secondary | ICD-10-CM | POA: Diagnosis not present

## 2023-10-26 ENCOUNTER — Ambulatory Visit (HOSPITAL_COMMUNITY): Admitting: Psychiatry

## 2023-11-28 ENCOUNTER — Other Ambulatory Visit (HOSPITAL_COMMUNITY): Payer: Self-pay | Admitting: Psychiatry

## 2023-11-28 DIAGNOSIS — F319 Bipolar disorder, unspecified: Secondary | ICD-10-CM

## 2023-12-01 ENCOUNTER — Other Ambulatory Visit: Payer: Self-pay | Admitting: Internal Medicine

## 2023-12-01 DIAGNOSIS — Z1231 Encounter for screening mammogram for malignant neoplasm of breast: Secondary | ICD-10-CM

## 2023-12-29 DIAGNOSIS — N1831 Chronic kidney disease, stage 3a: Secondary | ICD-10-CM | POA: Diagnosis not present

## 2023-12-29 DIAGNOSIS — Z1331 Encounter for screening for depression: Secondary | ICD-10-CM | POA: Diagnosis not present

## 2023-12-29 DIAGNOSIS — M8589 Other specified disorders of bone density and structure, multiple sites: Secondary | ICD-10-CM | POA: Diagnosis not present

## 2023-12-29 DIAGNOSIS — Z5181 Encounter for therapeutic drug level monitoring: Secondary | ICD-10-CM | POA: Diagnosis not present

## 2023-12-29 DIAGNOSIS — F319 Bipolar disorder, unspecified: Secondary | ICD-10-CM | POA: Diagnosis not present

## 2023-12-29 DIAGNOSIS — Z0000 Encounter for general adult medical examination without abnormal findings: Secondary | ICD-10-CM | POA: Diagnosis not present

## 2023-12-29 DIAGNOSIS — E7800 Pure hypercholesterolemia, unspecified: Secondary | ICD-10-CM | POA: Diagnosis not present

## 2024-01-01 DIAGNOSIS — L814 Other melanin hyperpigmentation: Secondary | ICD-10-CM | POA: Diagnosis not present

## 2024-01-01 DIAGNOSIS — D225 Melanocytic nevi of trunk: Secondary | ICD-10-CM | POA: Diagnosis not present

## 2024-01-01 DIAGNOSIS — L570 Actinic keratosis: Secondary | ICD-10-CM | POA: Diagnosis not present

## 2024-01-01 DIAGNOSIS — D692 Other nonthrombocytopenic purpura: Secondary | ICD-10-CM | POA: Diagnosis not present

## 2024-01-01 DIAGNOSIS — L821 Other seborrheic keratosis: Secondary | ICD-10-CM | POA: Diagnosis not present

## 2024-01-01 DIAGNOSIS — Z85828 Personal history of other malignant neoplasm of skin: Secondary | ICD-10-CM | POA: Diagnosis not present

## 2024-01-01 DIAGNOSIS — D1801 Hemangioma of skin and subcutaneous tissue: Secondary | ICD-10-CM | POA: Diagnosis not present

## 2024-01-18 ENCOUNTER — Encounter (HOSPITAL_COMMUNITY): Payer: Self-pay | Admitting: Psychiatry

## 2024-01-18 ENCOUNTER — Ambulatory Visit (HOSPITAL_COMMUNITY): Admitting: Psychiatry

## 2024-01-18 ENCOUNTER — Other Ambulatory Visit: Payer: Self-pay

## 2024-01-18 VITALS — BP 108/70 | HR 106 | Ht 64.0 in | Wt 129.0 lb

## 2024-01-18 DIAGNOSIS — F319 Bipolar disorder, unspecified: Secondary | ICD-10-CM | POA: Diagnosis not present

## 2024-01-18 DIAGNOSIS — F411 Generalized anxiety disorder: Secondary | ICD-10-CM | POA: Diagnosis not present

## 2024-01-18 MED ORDER — VENLAFAXINE HCL 100 MG PO TABS: 100.0000 mg | ORAL_TABLET | Freq: Two times a day (BID) | ORAL | 0 refills | Status: AC

## 2024-01-18 MED ORDER — LORAZEPAM 0.5 MG PO TABS: 0.2500 mg | ORAL_TABLET | Freq: Every day | ORAL | 1 refills | Status: AC

## 2024-01-18 MED ORDER — LAMOTRIGINE 150 MG PO TABS: 150.0000 mg | ORAL_TABLET | Freq: Every day | ORAL | 0 refills | Status: AC

## 2024-01-18 MED ORDER — QUETIAPINE FUMARATE 50 MG PO TABS: 50.0000 mg | ORAL_TABLET | Freq: Every day | ORAL | 0 refills | Status: AC

## 2024-01-18 NOTE — Progress Notes (Signed)
 BH MD/PA/NP OP Progress Note  Patient location; office  Provider location; office  01/18/2024 11:56 AM Sandra Cox  MRN:  [id number]  Chief Complaint:  Chief Complaint  Patient presents with   Follow-up   Medication Refill   HPI: Patient came for his pulm appointment in the office.  She is concerned about her husband who was recently admitted for A-fib but hoping to be discharged tomorrow.  She does not want to change the medication since it is working well.  She has no rash, itching tremor or shakes.  She sleeps good.  She continues to enjoy playing chess and lately reported doing very well.  She has a good social network at [employer] where she lives.  Patient reported sometimes stress and anxiety when she has to talk to her husband's girlfriend since husband is in the hospital.  She has no other means of communication because husband cannot talk when he is in the hospital.  Patient denies any mania, psychosis, hallucination, paranoia or any suicidal thoughts.  She like to keep the current medication.  Her appetite is okay and her weight is stable.  Visit Diagnosis:    ICD-10-CM   1. Bipolar I disorder (HCC)  F31.9 lamoTRIgine  (LAMICTAL ) 150 MG tablet    QUEtiapine  (SEROQUEL ) 50 MG tablet    2. GAD (generalized anxiety disorder)  F41.1 lamoTRIgine  (LAMICTAL ) 150 MG tablet    LORazepam  (ATIVAN ) 0.5 MG tablet    venlafaxine  (EFFEXOR ) 100 MG tablet        Past Psychiatric History: Reviewed H/O depression and mania. Saw psychiatrist in [place] , [place] and [place]. Saw Olam Pullis at [facility] and given Seroquel , Latuda and Zoloft. Lithium  and Latuda caused No h/o inpatient or suicidal attempt.      Past Medical History:  Past Medical History:  Diagnosis Date   Bipolar disorder (HCC)     Past Surgical History:  Procedure Laterality Date   CATARACT EXTRACTION Right    FACELIFT, LOWER 2/3  2014    Family Psychiatric History: Reviewed  Family History:  Family  History  Problem Relation Age of Onset   Stroke Mother    Heart attack Father    Healthy Daughter    Breast cancer Neg Hx     Social History:  Social History   Socioeconomic History   Marital status: Married    Spouse name: Not on file   Number of children: 2   Years of education: Not on file   Highest education level: Bachelor's degree (e.g., BA, AB, BS)  Occupational History   Occupation: retired    Comment: runner, broadcasting/film/video, 3rd grade x 7 years  Tobacco Use   Smoking status: Never   Smokeless tobacco: Never  Vaping Use   Vaping status: Never Used  Substance and Sexual Activity   Alcohol use: No   Drug use: No   Sexual activity: Not Currently  Other Topics Concern   Not on file  Social History Narrative   Not on file   Social Drivers of Health   Tobacco Use: Low Risk (10/19/2023)   Patient History    Smoking Tobacco Use: Never    Smokeless Tobacco Use: Never    Passive Exposure: Not on file  Financial Resource Strain: Not on file  Food Insecurity: Not on file  Transportation Needs: Not on file  Physical Activity: Not on file  Stress: Not on file  Social Connections: Not on file  Depression (EYV7-0): Not on file  Alcohol Screen: Not on file  Housing: Not on file  Utilities: Not on file  Health Literacy: Not on file    Allergies: No Known Allergies  Metabolic Disorder Labs: Lab Results  Component Value Date   HGBA1C 5.8 (H) 02/23/2023   No results found for: PROLACTIN No results found for: CHOL, TRIG, HDL, CHOLHDL, VLDL, LDLCALC Lab Results  Component Value Date   TSH 0.82 07/04/2017    Therapeutic Level Labs: No results found for: LITHIUM  No results found for: VALPROATE No results found for: CBMZ  Current Medications: Current Outpatient Medications  Medication Sig Dispense Refill   clobetasol (OLUX) 0.05 % topical foam   3   lamoTRIgine  (LAMICTAL ) 150 MG tablet Take 1 tablet (150 mg total) by mouth daily. 90 tablet 0   LORazepam   (ATIVAN ) 0.5 MG tablet Take 0.5-1 tablets (0.25-0.5 mg total) by mouth at bedtime. 30 tablet 1   QUEtiapine  (SEROQUEL ) 50 MG tablet Take 1 tablet (50 mg total) by mouth at bedtime. 90 tablet 0   rosuvastatin (CRESTOR) 10 MG tablet Take 10 mg by mouth daily.     venlafaxine  (EFFEXOR ) 100 MG tablet Take 1 tablet (100 mg total) by mouth 2 (two) times daily with a meal. 180 tablet 0   No current facility-administered medications for this visit.     Musculoskeletal: Strength & Muscle Tone: within normal limits Gait & Station: normal Patient leans: N/A  Psychiatric Specialty Exam: Review of Systems  Blood pressure 108/70, pulse (!) 106, height 5' 4 (1.626 m), weight 129 lb (58.5 kg).Body mass index is 22.14 kg/m.  General Appearance: Well Groomed  Eye Contact:  Good  Speech:  fast  Volume:  Normal  Mood:  pleasant   Affect:  Congruent  Thought Process:  Goal Directed  Orientation:  Full (Time, Place, and Person)  Thought Content: WDL   Suicidal Thoughts:  No  Homicidal Thoughts:  No  Memory:  Immediate;   Good Recent;   Good Remote;   Good  Judgement:  Fair  Insight:  Fair  Psychomotor Activity:  Tremor  Concentration:  Concentration: Fair and Attention Span: Fair  Recall:  Good  Fund of Knowledge: Good  Language: Good  Akathisia:  No  Handed:  Right  AIMS (if indicated): not done  Assets:  Communication Skills Desire for Improvement Housing Social Support Transportation  ADL's:  Intact  Cognition: WNL  Sleep:  Good   Screenings: PHQ2-9    Flowsheet Row Office Visit from 07/02/2016 in Primary Care at [facility] Total Score 0   Flowsheet Row ED from 10/24/2020 in [facility] Video Visit from 07/09/2020 [facility] Video Visit from 04/08/2020 in [facility]  C-SSRS RISK CATEGORY No Risk No Risk No Risk     Assessment and Plan: Patient is  78 year old female with history of bipolar disorder and anxiety.  Stable on current medication.  She denies any side effects.  Some anxiety related to her husband who is in the hospital for A-fib but hoping he will be discharged tomorrow.  Patient wants to keep the current medication.  She recently had a visit with her PCP Dr. Dwight and reported labs are normal.  No new medicine added.  Continue lamotrigine  150 mg daily, venlafaxine  100 mg twice a day, Seroquel  50 mg at bedtime and Ativan  half to 1 tablet as needed for anxiety.  Encourage hydration and continue exercise.  Recommend to call back if she is any question  or any concern.  Follow-up in 3 months.  Collaboration of Care: Collaboration of Care: Other provider involved in patient's care AEB notes are available in epic to review  Patient/Guardian was advised Release of Information must be obtained prior to any record release in order to collaborate their care with an outside provider. Patient/Guardian was advised if they have not already done so to contact the registration department to sign all necessary forms in order for us  to release information regarding their care.   Consent: Patient/Guardian gives verbal consent for treatment and assignment of benefits for services provided during this visit. Patient/Guardian expressed understanding and agreed to proceed.   I provided 19 minutes face-to-face time during this encounter.   Leni ONEIDA Client, MD 01/18/2024, 11:56 AM

## 2024-02-21 ENCOUNTER — Other Ambulatory Visit (HOSPITAL_BASED_OUTPATIENT_CLINIC_OR_DEPARTMENT_OTHER): Payer: Self-pay | Admitting: Internal Medicine

## 2024-02-21 DIAGNOSIS — M8589 Other specified disorders of bone density and structure, multiple sites: Secondary | ICD-10-CM

## 2024-02-26 ENCOUNTER — Telehealth (HOSPITAL_COMMUNITY): Payer: Self-pay

## 2024-02-26 NOTE — Telephone Encounter (Signed)
 Patient called this morning, she is having some severe anxiety and depression. Patient states she is isolating and does not want to get out of bed. Patients speech was pressured and shaky. She denied suicidal thoughts but says she believes everything would be easier if she went to sleep and did not wake up. You have no openings right now and I think patient really needs to speak with you. Please review and advise, thank you

## 2024-02-26 NOTE — Telephone Encounter (Signed)
 I returned phone call.  Patient very anxious and tearful.  She reported husband was admitted in the hospital and she went to see him and she was not happy how the husband's girlfriend treated her.  Now husband is doing better and he is coming to see the patient and patient does not want to have a visit with him.  She is distraught how she was treated and she is very nervous that she cannot say no to her husband.  She denies any hallucination, paranoia, suicidal thoughts.  She is sleeping okay.  I recommend to take 0.5 Ativan  to calm herself and informed the husband that she is not going to visit him and expressed how she feel about him and his girlfriend.  Reassurance given.  Patient verbalized and acknowledged.  She will take the Ativan  and inform the husband that she is not going to visit him.  I encouraged to call back if symptoms do not improve.  She like to keep the current appointment but promised to give us  a callback if she need to be seen sooner.

## 2024-03-01 ENCOUNTER — Encounter (HOSPITAL_COMMUNITY): Payer: Self-pay | Admitting: Psychiatry

## 2024-03-01 ENCOUNTER — Telehealth (HOSPITAL_COMMUNITY): Admitting: Psychiatry

## 2024-03-01 DIAGNOSIS — F319 Bipolar disorder, unspecified: Secondary | ICD-10-CM | POA: Diagnosis not present

## 2024-03-01 DIAGNOSIS — F411 Generalized anxiety disorder: Secondary | ICD-10-CM | POA: Diagnosis not present

## 2024-03-01 NOTE — Progress Notes (Signed)
 " [facility] MD Virtual Progress Note   Patient Location: Home Provider Location: Home Office  I connect with patient by telephone and verified that I am speaking with correct person by using two identifiers. I discussed the limitations of evaluation and management by telemedicine and the availability of in person appointments. I also discussed with the patient that there may be a patient responsible charge related to this service. The patient expressed understanding and agreed to proceed.  Sandra Cox [id number] 79 y.o.  03/01/2024 10:18 AM  History of Present Illness:  Patient requested an earlier appointment.  She was very anxious and nervous last week.  She had called us  because she was going through a lot of anxiety.  Her husband was admitted in the hospital and upon release wanted to come to see her but she was very nervous to see him.  She was also not happy with her husband's girlfriend because she was treated not very good when she visited her husband in the hospital.  She was given reassurance.  Recommend to take extra Ativan .  Patient took the extra Ativan  and calm herself down.  She was able to to visit the husband and it went well.  She feels her symptoms are back to normal and she is sleeping good.  She does not have any panic attack.  She is taking Lamictal  Seroquel  and Ativan  half tablet.  She enjoying playing chess and had good social network.  She denies any hallucination, paranoia, mania, psychosis.  Her appetite is okay.  She wants to keep the current medication but like to keep the appointment in March.  Past Psychiatric History: H/O depression and mania. Saw psychiatrist in [place] , [place] and [place]. Saw Olam Pullis at [facility] and given Seroquel , Latuda and Zoloft. Lithium  and Latuda caused No h/o inpatient or suicidal attempt.      Past Medical History:  Diagnosis Date   Bipolar disorder [employer])     Outpatient Encounter Medications as of  03/01/2024  Medication Sig   clobetasol (OLUX) 0.05 % topical foam    lamoTRIgine  (LAMICTAL ) 150 MG tablet Take 1 tablet (150 mg total) by mouth daily.   LORazepam  (ATIVAN ) 0.5 MG tablet Take 0.5-1 tablets (0.25-0.5 mg total) by mouth at bedtime.   QUEtiapine  (SEROQUEL ) 50 MG tablet Take 1 tablet (50 mg total) by mouth at bedtime.   rosuvastatin (CRESTOR) 10 MG tablet Take 10 mg by mouth daily.   venlafaxine  (EFFEXOR ) 100 MG tablet Take 1 tablet (100 mg total) by mouth 2 (two) times daily with a meal.   No facility-administered encounter medications on file as of 03/01/2024.    No results found for this or any previous visit (from the past 2160 hours).   Psychiatric Specialty Exam: Physical Exam  Review of Systems  Weight 129 lb (58.5 kg).There is no height or weight on file to calculate BMI.  General Appearance: NA  Eye Contact:  NA  Speech:  fast but clear  Volume:  Normal  Mood:  Euthymic  Affect:  Congruent  Thought Process:  Goal Directed  Orientation:  Full (Time, Place, and Person)  Thought Content:  WDL  Suicidal Thoughts:  No  Homicidal Thoughts:  No  Memory:  Immediate;   Good Recent;   Good Remote;   Good  Judgement:  Good  Insight:  Good  Psychomotor Activity:  Normal  Concentration:  Concentration: Good and Attention Span: Good  Recall:  Good  Fund of Knowledge:  Good  Language:  Good  Akathisia:  No  Handed:  Right  AIMS (if indicated):     Assets:  Communication Skills Desire for Improvement Housing Transportation  ADL's:  Intact  Cognition:  WNL  Sleep:  ok       07/02/2016    3:35 PM  Depression screen PHQ 2/9  Decreased Interest 0  Down, Depressed, Hopeless 0  PHQ - 2 Score 0    Assessment/Plan: Bipolar I disorder (HCC)  GAD (generalized anxiety disorder)  Patient is a 79 year old female with bipolar disorder and anxiety.  She is doing better after took extra Ativan  that helped to calm her down when husband came to visit her.  She feels  back to herself.  She is pleased things are going well and she is taking the medication as prescribed.  She does not want to add more medication.  Will keep the current medication.  Patient has appointment coming up in March and she like to keep that appointment.  Reassurance given.  Recommend to use breathing technique and coping skills to calm herself down.  Continue Lamictal  150 mg daily, venlafaxine  100 mg twice a day, Seroquel  50 mg at bedtime and Ativan  0.5 mg half to 1 tablet as needed.  Encouraged to call back if she is any question.  Follow-up in March.   Follow Up Instructions:     I discussed the assessment and treatment plan with the patient. The patient was provided an opportunity to ask questions and all were answered. The patient agreed with the plan and demonstrated an understanding of the instructions.   The patient was advised to call back or seek an in-person evaluation if the symptoms worsen or if the condition fails to improve as anticipated.    Collaboration of Care: Other provider involved in patient's care AEB notes are available in epic to review  Patient/Guardian was advised Release of Information must be obtained prior to any record release in order to collaborate their care with an outside provider. Patient/Guardian was advised if they have not already done so to contact the registration department to sign all necessary forms in order for us  to release information regarding their care.   Consent: Patient/Guardian gives verbal consent for treatment and assignment of benefits for services provided during this visit. Patient/Guardian expressed understanding and agreed to proceed.     Total encounter time 12 minutes which includes, chart reviewed, obtain history, examination, reviewing and communication results, placing orders, care coordination and documentation clinical information in EHR during this encounter.   Note: This document was prepared by [employer] voice  dictation technology and any errors that results from this process are unintentional.    Leni ONEIDA Client, MD 03/01/2024   "

## 2024-04-18 ENCOUNTER — Ambulatory Visit (HOSPITAL_COMMUNITY): Admitting: Psychiatry

## 2024-10-21 ENCOUNTER — Ambulatory Visit
